# Patient Record
Sex: Female | Born: 1945 | Race: White | Hispanic: No | Smoking: Former smoker
Health system: Southern US, Community
[De-identification: ages and names within clinical notes are randomized; demographics above are authoritative.]

## PROBLEM LIST (undated history)

## (undated) ENCOUNTER — Ambulatory Visit: Admission: EM | Payer: Medicare Other | Source: Home / Self Care

## (undated) DIAGNOSIS — F0150 Vascular dementia without behavioral disturbance: Secondary | ICD-10-CM

## (undated) DIAGNOSIS — N816 Rectocele: Secondary | ICD-10-CM

## (undated) DIAGNOSIS — I10 Essential (primary) hypertension: Secondary | ICD-10-CM

## (undated) DIAGNOSIS — C801 Malignant (primary) neoplasm, unspecified: Secondary | ICD-10-CM

## (undated) DIAGNOSIS — K219 Gastro-esophageal reflux disease without esophagitis: Secondary | ICD-10-CM

## (undated) DIAGNOSIS — Z87891 Personal history of nicotine dependence: Principal | ICD-10-CM

## (undated) DIAGNOSIS — N3946 Mixed incontinence: Secondary | ICD-10-CM

## (undated) DIAGNOSIS — Z720 Tobacco use: Secondary | ICD-10-CM

## (undated) DIAGNOSIS — N183 Chronic kidney disease, stage 3 (moderate): Secondary | ICD-10-CM

## (undated) DIAGNOSIS — E782 Mixed hyperlipidemia: Secondary | ICD-10-CM

## (undated) HISTORY — DX: Gastro-esophageal reflux disease without esophagitis: K21.9

## (undated) HISTORY — DX: Personal history of nicotine dependence: Z87.891

## (undated) HISTORY — DX: Rectocele: N81.6

## (undated) HISTORY — PX: ABDOMINAL HYSTERECTOMY: SHX81

## (undated) HISTORY — DX: Chronic kidney disease, stage 3 (moderate): N18.3

## (undated) HISTORY — PX: HIP SURGERY: SHX245

## (undated) HISTORY — DX: Mixed hyperlipidemia: E78.2

## (undated) HISTORY — PX: REPLACEMENT TOTAL KNEE BILATERAL: SUR1225

## (undated) HISTORY — DX: Tobacco use: Z72.0

## (undated) HISTORY — DX: Mixed incontinence: N39.46

## (undated) HISTORY — DX: Essential (primary) hypertension: I10

---

## 2004-07-13 ENCOUNTER — Emergency Department: Payer: Self-pay | Admitting: Emergency Medicine

## 2004-07-14 ENCOUNTER — Ambulatory Visit: Payer: Self-pay | Admitting: Emergency Medicine

## 2004-10-03 ENCOUNTER — Ambulatory Visit: Payer: Self-pay | Admitting: Occupational Therapy

## 2005-01-07 ENCOUNTER — Emergency Department: Payer: Self-pay | Admitting: Emergency Medicine

## 2005-01-26 ENCOUNTER — Ambulatory Visit: Payer: Self-pay | Admitting: Occupational Therapy

## 2005-02-02 ENCOUNTER — Ambulatory Visit: Payer: Self-pay | Admitting: Unknown Physician Specialty

## 2005-02-17 ENCOUNTER — Ambulatory Visit: Payer: Self-pay | Admitting: Occupational Therapy

## 2006-02-01 ENCOUNTER — Ambulatory Visit: Payer: Self-pay | Admitting: Nurse Practitioner

## 2006-02-05 ENCOUNTER — Ambulatory Visit: Payer: Self-pay | Admitting: Nurse Practitioner

## 2007-02-07 ENCOUNTER — Ambulatory Visit: Payer: Self-pay | Admitting: Nurse Practitioner

## 2008-02-09 ENCOUNTER — Ambulatory Visit: Payer: Self-pay | Admitting: Nurse Practitioner

## 2008-09-10 ENCOUNTER — Ambulatory Visit: Payer: Self-pay | Admitting: Nurse Practitioner

## 2009-02-18 ENCOUNTER — Ambulatory Visit: Payer: Self-pay | Admitting: Nurse Practitioner

## 2010-02-20 ENCOUNTER — Ambulatory Visit: Payer: Self-pay | Admitting: Nurse Practitioner

## 2010-03-04 ENCOUNTER — Ambulatory Visit: Payer: Self-pay | Admitting: Nurse Practitioner

## 2010-10-09 DIAGNOSIS — Z720 Tobacco use: Secondary | ICD-10-CM | POA: Insufficient documentation

## 2010-10-09 DIAGNOSIS — I10 Essential (primary) hypertension: Secondary | ICD-10-CM

## 2010-10-09 DIAGNOSIS — E782 Mixed hyperlipidemia: Secondary | ICD-10-CM

## 2010-10-09 HISTORY — DX: Tobacco use: Z72.0

## 2010-10-09 HISTORY — DX: Mixed hyperlipidemia: E78.2

## 2010-10-09 HISTORY — DX: Essential (primary) hypertension: I10

## 2011-03-02 ENCOUNTER — Ambulatory Visit: Payer: Self-pay | Admitting: Nurse Practitioner

## 2012-03-02 ENCOUNTER — Ambulatory Visit: Payer: Self-pay | Admitting: Nurse Practitioner

## 2012-10-10 DIAGNOSIS — N816 Rectocele: Secondary | ICD-10-CM

## 2012-10-10 DIAGNOSIS — N3946 Mixed incontinence: Secondary | ICD-10-CM

## 2012-10-10 HISTORY — DX: Mixed incontinence: N39.46

## 2012-10-10 HISTORY — DX: Rectocele: N81.6

## 2012-10-26 ENCOUNTER — Ambulatory Visit: Payer: Self-pay | Admitting: Family Medicine

## 2012-10-26 ENCOUNTER — Ambulatory Visit: Payer: Self-pay

## 2012-10-26 LAB — URINALYSIS, COMPLETE
Bilirubin,UR: NEGATIVE
Glucose,UR: NEGATIVE mg/dL (ref 0–75)
Ketone: NEGATIVE
Nitrite: POSITIVE
Ph: 6.5 (ref 4.5–8.0)

## 2012-10-26 LAB — CBC WITH DIFFERENTIAL/PLATELET
Basophil %: 0.2 %
Eosinophil %: 0 %
HCT: 35.8 % (ref 35.0–47.0)
HGB: 11.9 g/dL — ABNORMAL LOW (ref 12.0–16.0)
Lymphocyte #: 0.7 10*3/uL — ABNORMAL LOW (ref 1.0–3.6)
Lymphocyte %: 5.5 %
MCHC: 33.3 g/dL (ref 32.0–36.0)
MCV: 89 fL (ref 80–100)
Monocyte %: 9.6 %
Neutrophil #: 11.4 10*3/uL — ABNORMAL HIGH (ref 1.4–6.5)
Neutrophil %: 84.7 %
WBC: 13.4 10*3/uL — ABNORMAL HIGH (ref 3.6–11.0)

## 2012-10-26 LAB — COMPREHENSIVE METABOLIC PANEL
Albumin: 3.4 g/dL (ref 3.4–5.0)
BUN: 20 mg/dL — ABNORMAL HIGH (ref 7–18)
Bilirubin,Total: 1.3 mg/dL — ABNORMAL HIGH (ref 0.2–1.0)
Calcium, Total: 9.2 mg/dL (ref 8.5–10.1)
Co2: 24 mmol/L (ref 21–32)
Creatinine: 1.58 mg/dL — ABNORMAL HIGH (ref 0.60–1.30)
EGFR (African American): 39 — ABNORMAL LOW
Glucose: 127 mg/dL — ABNORMAL HIGH (ref 65–99)
Osmolality: 265 (ref 275–301)
SGOT(AST): 82 U/L — ABNORMAL HIGH (ref 15–37)
Sodium: 130 mmol/L — ABNORMAL LOW (ref 136–145)
Total Protein: 7.7 g/dL (ref 6.4–8.2)

## 2012-10-26 LAB — AMYLASE: Amylase: 27 U/L (ref 25–115)

## 2012-12-09 DIAGNOSIS — N183 Chronic kidney disease, stage 3 unspecified: Secondary | ICD-10-CM

## 2012-12-09 HISTORY — DX: Chronic kidney disease, stage 3 unspecified: N18.30

## 2013-03-03 ENCOUNTER — Ambulatory Visit: Payer: Self-pay

## 2013-03-04 ENCOUNTER — Ambulatory Visit: Payer: Self-pay | Admitting: Physician Assistant

## 2014-02-09 ENCOUNTER — Ambulatory Visit: Payer: Self-pay | Admitting: Family Medicine

## 2014-03-07 ENCOUNTER — Ambulatory Visit: Payer: Self-pay | Admitting: Nurse Practitioner

## 2015-01-09 ENCOUNTER — Other Ambulatory Visit: Payer: Self-pay | Admitting: Nurse Practitioner

## 2015-01-09 DIAGNOSIS — Z1239 Encounter for other screening for malignant neoplasm of breast: Secondary | ICD-10-CM

## 2015-01-09 DIAGNOSIS — R0989 Other specified symptoms and signs involving the circulatory and respiratory systems: Secondary | ICD-10-CM

## 2015-01-28 ENCOUNTER — Ambulatory Visit: Admission: RE | Admit: 2015-01-28 | Payer: Self-pay | Source: Ambulatory Visit

## 2015-01-28 ENCOUNTER — Ambulatory Visit
Admission: RE | Admit: 2015-01-28 | Discharge: 2015-01-28 | Disposition: A | Discharge status: Home / Self Care | Payer: Medicare PPO | Source: Ambulatory Visit | Attending: Nurse Practitioner | Admitting: Nurse Practitioner

## 2015-01-28 DIAGNOSIS — R0989 Other specified symptoms and signs involving the circulatory and respiratory systems: Secondary | ICD-10-CM | POA: Insufficient documentation

## 2015-01-28 DIAGNOSIS — I6523 Occlusion and stenosis of bilateral carotid arteries: Secondary | ICD-10-CM | POA: Diagnosis not present

## 2015-01-29 ENCOUNTER — Telehealth: Payer: Self-pay | Admitting: *Deleted

## 2015-01-29 NOTE — Telephone Encounter (Signed)
  Oncology Nurse Navigator Documentation    Navigator Encounter Type: Telephone;Screening (01/29/15 1400)                          Notified patient that annual lung cancer screening low dose CT scan is due. Confirmed that patient is within the age range of 55-77, and asymptomatic, (no signs or symptoms of lung cancer). The patient is a current smoker, with a 31 pack year history. The shared decision making visit was done 02/09/14 Patient is agreeable for CT scan being scheduled after 02/10/15.

## 2015-02-05 ENCOUNTER — Other Ambulatory Visit: Payer: Self-pay | Admitting: Family Medicine

## 2015-02-05 ENCOUNTER — Encounter: Payer: Self-pay | Admitting: Family Medicine

## 2015-02-05 DIAGNOSIS — Z87891 Personal history of nicotine dependence: Secondary | ICD-10-CM

## 2015-02-05 HISTORY — DX: Personal history of nicotine dependence: Z87.891

## 2015-02-12 ENCOUNTER — Ambulatory Visit
Admission: RE | Admit: 2015-02-12 | Discharge: 2015-02-12 | Disposition: A | Discharge status: Home / Self Care | Payer: Medicare PPO | Source: Ambulatory Visit | Attending: Family Medicine | Admitting: Family Medicine

## 2015-02-12 DIAGNOSIS — I700 Atherosclerosis of aorta: Secondary | ICD-10-CM | POA: Diagnosis not present

## 2015-02-12 DIAGNOSIS — Z87891 Personal history of nicotine dependence: Secondary | ICD-10-CM | POA: Diagnosis present

## 2015-02-12 DIAGNOSIS — I2510 Atherosclerotic heart disease of native coronary artery without angina pectoris: Secondary | ICD-10-CM | POA: Insufficient documentation

## 2015-02-19 ENCOUNTER — Telehealth: Payer: Self-pay | Admitting: *Deleted

## 2015-02-19 NOTE — Telephone Encounter (Signed)
Notified patient of LDCT lung cancer screening results of Lung [facility] 1 finding with recommendation for 12 month follow up imaging. Also notified of incidental finding of coronary athersclerosis. Patient verbalizes understanding.

## 2015-03-12 ENCOUNTER — Ambulatory Visit: Payer: Self-pay

## 2015-04-16 ENCOUNTER — Ambulatory Visit
Admission: RE | Admit: 2015-04-16 | Discharge: 2015-04-16 | Disposition: A | Discharge status: Home / Self Care | Payer: Medicare PPO | Source: Ambulatory Visit | Attending: Nurse Practitioner | Admitting: Nurse Practitioner

## 2015-04-16 DIAGNOSIS — Z1231 Encounter for screening mammogram for malignant neoplasm of breast: Secondary | ICD-10-CM | POA: Diagnosis present

## 2015-04-16 DIAGNOSIS — Z1239 Encounter for other screening for malignant neoplasm of breast: Secondary | ICD-10-CM

## 2015-04-16 HISTORY — DX: Malignant (primary) neoplasm, unspecified: C80.1

## 2015-05-22 ENCOUNTER — Ambulatory Visit (INDEPENDENT_AMBULATORY_CARE_PROVIDER_SITE_OTHER): Payer: Medicare PPO | Admitting: Urology

## 2015-05-22 ENCOUNTER — Encounter: Payer: Self-pay | Admitting: Urology

## 2015-05-22 VITALS — BP 111/75 | HR 71 | Ht 66.0 in | Wt 211.8 lb

## 2015-05-22 DIAGNOSIS — N3946 Mixed incontinence: Secondary | ICD-10-CM

## 2015-05-22 DIAGNOSIS — N819 Female genital prolapse, unspecified: Secondary | ICD-10-CM

## 2015-05-22 DIAGNOSIS — N390 Urinary tract infection, site not specified: Secondary | ICD-10-CM | POA: Diagnosis not present

## 2015-05-22 LAB — URINALYSIS, COMPLETE
BILIRUBIN UA: NEGATIVE
Glucose, UA: NEGATIVE
Ketones, UA: NEGATIVE
LEUKOCYTES UA: NEGATIVE
NITRITE UA: NEGATIVE
PH UA: 6.5 (ref 5.0–7.5)
Protein, UA: NEGATIVE
RBC UA: NEGATIVE
Specific Gravity, UA: 1.01 (ref 1.005–1.030)
UUROB: 0.2 mg/dL (ref 0.2–1.0)

## 2015-05-22 LAB — MICROSCOPIC EXAMINATION: RBC MICROSCOPIC, UA: NONE SEEN /HPF (ref 0–?)

## 2015-05-22 LAB — BLADDER SCAN AMB NON-IMAGING

## 2015-05-22 MED ORDER — METHENAMINE HIPPURATE 1 G PO TABS: 1.0000 g | ORAL_TABLET | Freq: Two times a day (BID) | ORAL | Status: DC

## 2015-05-22 NOTE — Progress Notes (Signed)
05/22/2015 3:06 PM   Nancy Perkins 1946/01/10 [id number]  Referring provider: Renee Rival, NP [address] [place], [place] [address]  Chief Complaint  Patient presents with  . New Patient (Initial Visit)    chronic UTI    HPI:  72- yo F with longstanding history of recurrent UTIs, pelvic organ prolapse, and mixed urinary incontinence.  She was referred today for further evaluation of recurrent urinary tract infections.  She has had at least 5 culture positive urinary tract infections with past year has breakthrough infections on prophylactic Macrobid.   One of her UTIs was resistant to oral abx but treated successfully with fosfomycin (Citrobacter).   She has been taking Macrobid suppression for many years.  She has been using cranberry capsules as well. She was prescribed but is not using her Premarin cream as she finds this to be "messy".   She is very careful about hygiene and wiping front to back.  She generally does not have fevers with her infections. Her last episode of pyelonephritis was in 2014.   She has an extensive GYN history and undergone reconstruction in the past.  She is currently followed by Dr. Carol Ada at [facility].  She has history of mixed urinary incontinence,  history of stage III posterior vaginal wall prolapse. She underwent total vaginal hysterectomy in the past resulting in significant pelvic organ prolapse. In 2007, she underwent an abdominal sacral colpopexy, Burch procedure, and paravaginal repair.  She is not sexually active. She has tried and failed pessaries.  Last cystoscopy in 1 year ago  by Dr. Lelon Huh at [facility].  No recent renal/ bladder imaging.     She does have baseline CKD , stage III, current and 1.2. She is also recently diagnosed with diabetes, hemoglobin A1c 6.4 , currently diet controlled only.  In terms of urinary symptoms, she does have mixed urinary incontinence with mild stress component.  She has been actively losing  weight and seen an improvement in her symptoms.  She also has a known history of incomplete bladder emptying and double voids on a regular basis.  No history of gross hematuria.  She is not currently taking any medications for her overactive component.  No personal history of kidney stones.   PMH: Past Medical History  Diagnosis Date  . Personal history of tobacco use, presenting hazards to health 02/05/2015  . Cancer ([place])     skin ca  . Acid reflux   . Essential (primary) hypertension 10/09/2010  . Hernia, rectovaginal 10/10/2012  . Chronic kidney disease (CKD), stage III (moderate) 12/09/2012  . 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 Current tobacco use 10/09/2010  . Mixed incontinence 10/10/2012    Surgical History: Past Surgical History  Procedure Laterality Date  . Abdominal hysterectomy    . Hip surgery    . Replacement total knee bilateral      Home Medications:    Medication List       This list is accurate as of: 05/22/15 11:59 PM.  Always use your most recent med list.               aspirin 81 MG tablet  Take 81 mg by mouth.     CELEXA 40 MG tablet  Generic drug:  citalopram  Take 40 mg by mouth.     KLOR-CON 10 10 MEQ tablet  Generic drug:  potassium chloride  Take by mouth.     Magnesium 250 MG Tabs  Take  250 mg by mouth.     methenamine 1 g tablet  Commonly known as:  HIPREX  Take 1 tablet (1 g total) by mouth 2 (two) times daily with a meal.     nystatin cream  Commonly known as:  MYCOSTATIN  continuous as needed.     omeprazole 40 MG capsule  Commonly known as:  PRILOSEC     PREMARIN vaginal cream  Generic drug:  conjugated estrogens  1 gm as directed 2-3 times weekly     simvastatin 20 MG tablet  Commonly known as:  ZOCOR     STOOL SOFTENER 100 MG capsule  Generic drug:  docusate sodium  Take 100 mg by mouth.     Vitamin D (Ergocalciferol) 50000 units Caps capsule  Commonly known as:  DRISDOL  Take by mouth.          Allergies:  Allergies  Allergen Reactions  . Beta Adrenergic Blockers Other (See Comments)    Heart block  . Hydrochlorothiazide Other (See Comments)    Caused a heartblock  . Sulfathiazole     Other reaction(s): RASH  . Thiazide-Type Diuretics     Heart block caused by beta blockers in general    Family History: Family History  Problem Relation Age of Onset  . Breast cancer Sister 73  . Breast cancer Maternal Aunt   . Breast cancer Cousin 17    paternal  . Bladder Cancer Neg Hx   . Prostate cancer Maternal Grandfather   . Kidney cancer Neg Hx     Social History:  reports that she has been smoking.  She does not have any smokeless tobacco history on file. Her alcohol and drug histories are not on file.  ROS: UROLOGY Frequent Urination?: Yes Hard to postpone urination?: Yes Burning/pain with urination?: Yes Get up at night to urinate?: No Leakage of urine?: Yes Urine stream starts and stops?: No Trouble starting stream?: No Do you have to strain to urinate?: No Blood in urine?: No Urinary tract infection?: Yes Sexually transmitted disease?: No Injury to kidneys or bladder?: No Painful intercourse?: No Weak stream?: No Currently pregnant?: No Vaginal bleeding?: No Last menstrual period?: hysterectomy  Gastrointestinal Nausea?: No Vomiting?: No Indigestion/heartburn?: No Diarrhea?: No Constipation?: No  Constitutional Fever: No Night sweats?: No Weight loss?: No Fatigue?: No  Skin Skin rash/lesions?: Yes Itching?: No  Eyes Blurred vision?: No Double vision?: No  Ears/Nose/Throat Sore throat?: No Sinus problems?: Yes  Hematologic/Lymphatic Swollen glands?: No Easy bruising?: Yes  Cardiovascular Leg swelling?: Yes Chest pain?: No  Respiratory Cough?: No Shortness of breath?: No  Endocrine Excessive thirst?: No  Musculoskeletal Back pain?: Yes Joint pain?: Yes  Neurological Headaches?: No Dizziness?:  No  Psychologic Depression?: Yes Anxiety?: Yes  Physical Exam: BP 111/75 mmHg  Pulse 71  Ht 5\' 6"  (1.676 m)  Wt 211 lb 12.8 oz (96.072 kg)  BMI 34.20 kg/m2  Constitutional:  Alert and oriented, No acute distress. HEENT: [place] AT, moist mucus membranes.  Trachea midline, no masses. Cardiovascular: No clubbing, cyanosis.  Bilateral pitting lower extremity edema, left greater than right with significant varicose veins. Respiratory: Normal respiratory effort, no increased work of breathing. GI: Abdomen is soft, nontender, nondistended, no abdominal masses. Obese. GU: No CVA tenderness.  Skin: No rashes, bruises or suspicious lesions. Lymph: No cervical adenopathy. Neurologic: Grossly intact, no focal deficits, moving all 4 extremities. Psychiatric: Normal mood and affect.  Laboratory Data: Cr 1.2  Urinalysis Results for orders placed or performed in  visit on 05/22/15  Microscopic Examination  Result Value Ref Range   WBC, UA 0-5 0 -  5 /hpf   RBC, UA None seen 0 -  2 /hpf   Epithelial Cells (non renal) 0-10 0 - 10 /hpf   Mucus, UA Present (A) Not Estab.   Bacteria, UA Few (A) None seen/Few  Urinalysis, Complete  Result Value Ref Range   Specific Gravity, UA 1.010 1.005 - 1.030   pH, UA 6.5 5.0 - 7.5   Color, UA Yellow Yellow   Appearance Ur Clear Clear   Leukocytes, UA Negative Negative   Protein, UA Negative Negative/Trace   Glucose, UA Negative Negative   Ketones, UA Negative Negative   RBC, UA Negative Negative   Bilirubin, UA Negative Negative   Urobilinogen, Ur 0.2 0.2 - 1.0 mg/dL   Nitrite, UA Negative Negative   Microscopic Examination See below:   Bladder Scan (Post Void Residual) in office  Result Value Ref Range   Scan Result 49ml    Urine culture data reviewed.  Pertinent Imaging: No recent KUB/ CT/ or renal ultrasound data  Assessment & Plan:    1. Recurrent UTI History of long-standing recurrent breakthrough urinary tract infections despite Macrobid  prophylaxis. Recent cultures have been resistant to Macrobid. She is being very appropriately managed by urogynecology, Dr. Lelon Huh with cranberry tabs, recommendation of estrogen cream, hygiene recommendations, etc. Given the concern for development of antibiotic resistance and pulmonary fibrosis, I have recommended cessation of Macrobid prophylaxis and we will start her on methenamine instead. We reviewed other behavioral recommendations including continuation of double voiding although no evidence of incomplete bladder emptying today, and increasing water intake which she admits to being slack about. I have also recommended that she resume her vaginal/periurethral estrogen cream as this is the most evidence based recommendation for urinary tract infection prevention in postmenopausal women.  Cystoscopy performed approximate one year ago therefore no need to repeat the study. For completeness, I have recommended obtaining a renal ultrasound to ensure that there are no upper tract nidus is for recurrent infections.   - Urinalysis, Complete - Bladder Scan (Post Void Residual) in office - RUS (will call with results) methenamine ordered  2. Mixed stress and urge urinary incontinence Minimal bother. Recommend continuation of Kegal exercises and weight loss.  3. Prolapse of female pelvic organs Managed by Dr. Lelon Huh at [facility].   Return in about 1 year (around 05/21/2016) for symtom recheck. or sooner as needed  Hollice Espy, MD  [facility] [address], [place], [place] [address] [phone]

## 2015-06-03 ENCOUNTER — Ambulatory Visit
Admission: RE | Admit: 2015-06-03 | Discharge: 2015-06-03 | Disposition: A | Discharge status: Home / Self Care | Payer: Medicare PPO | Source: Ambulatory Visit | Attending: Urology | Admitting: Urology

## 2015-06-03 DIAGNOSIS — N281 Cyst of kidney, acquired: Secondary | ICD-10-CM | POA: Insufficient documentation

## 2015-06-03 DIAGNOSIS — N390 Urinary tract infection, site not specified: Secondary | ICD-10-CM | POA: Insufficient documentation

## 2015-06-04 ENCOUNTER — Telehealth: Payer: Self-pay

## 2015-06-04 NOTE — Telephone Encounter (Signed)
-----   Message from Hollice Espy, MD sent at 06/03/2015  8:17 PM EST ----- Please let this patient notes that her renal ultrasound looks great. There is no evidence of kidney stones or any swelling of her kidneys.  She does have a simple benign cyst on the right kidney which is less than a centimeter and should not be causing pain or any other issues.    Hollice Espy, MD

## 2015-06-04 NOTE — Telephone Encounter (Signed)
Spoke with patient and gave her the results and she had no questions  Sharyn Lull

## 2015-06-04 NOTE — Telephone Encounter (Signed)
LMOM

## 2015-06-05 ENCOUNTER — Encounter: Payer: Self-pay | Admitting: Obstetrics and Gynecology

## 2015-06-05 ENCOUNTER — Ambulatory Visit (INDEPENDENT_AMBULATORY_CARE_PROVIDER_SITE_OTHER): Payer: Medicare PPO | Admitting: Obstetrics and Gynecology

## 2015-06-05 VITALS — BP 152/82 | HR 72 | Ht 66.0 in | Wt 210.0 lb

## 2015-06-05 DIAGNOSIS — N390 Urinary tract infection, site not specified: Secondary | ICD-10-CM | POA: Diagnosis not present

## 2015-06-05 LAB — URINALYSIS, COMPLETE
BILIRUBIN UA: NEGATIVE
GLUCOSE, UA: NEGATIVE
KETONES UA: NEGATIVE
Leukocytes, UA: NEGATIVE
NITRITE UA: POSITIVE — AB
SPEC GRAV UA: 1.015 (ref 1.005–1.030)
UUROB: 0.2 mg/dL (ref 0.2–1.0)
pH, UA: 6 (ref 5.0–7.5)

## 2015-06-05 LAB — MICROSCOPIC EXAMINATION
RBC, UA: NONE SEEN /hpf (ref 0–?)
WBC, UA: >30 /hpf — ABNORMAL HIGH (ref 0–?)

## 2015-06-05 NOTE — Progress Notes (Signed)
12:23 PM   Nancy Perkins 1946/04/25 [id number]  Referring provider: Renee Rival, NP [address] [place], [place] [address]  Chief Complaint  Patient presents with  . Recurrent UTI    HPI: 63- yo F with longstanding history of recurrent UTIs, pelvic organ prolapse, and mixed urinary incontinence.  She was referred today for further evaluation of recurrent urinary tract infections.  She has had at least 5 culture positive urinary tract infections with past year has breakthrough infections on prophylactic Macrobid.   One of her UTIs was resistant to oral abx but treated successfully with fosfomycin (Citrobacter).   She has been taking Macrobid suppression for many years.  She has been using cranberry capsules as well. She was prescribed but is not using her Premarin cream as she finds this to be "messy".   She is very careful about hygiene and wiping front to back.  She generally does not have fevers with her infections. Her last episode of pyelonephritis was in 2014.   She has an extensive GYN history and undergone reconstruction in the past.  She is currently followed by Dr. Carol Ada at [facility].  She has history of mixed urinary incontinence,  history of stage III posterior vaginal wall prolapse. She underwent total vaginal hysterectomy in the past resulting in significant pelvic organ prolapse. In 2007, she underwent an abdominal sacral colpopexy, Burch procedure, and paravaginal repair.  She is not sexually active. She has tried and failed pessaries.  Last cystoscopy in 1 year ago  by Dr. Lelon Huh at [facility].  No recent renal/ bladder imaging.     She does have baseline CKD , stage III, current and 1.2. She is also recently diagnosed with diabetes, hemoglobin A1c 6.4 , currently diet controlled only.  In terms of urinary symptoms, she does have mixed urinary incontinence with mild stress component.  She has been actively losing weight and seen an improvement in her  symptoms.  She also has a known history of incomplete bladder emptying and double voids on a regular basis.  No history of gross hematuria.  She is not currently taking any medications for her overactive component.  No personal history of kidney stones.  Interval History: Patient states that she has discontinued Macrobid and started methenamine as recommended last visit. She has been trying to use her vaginal estrogen cream regularly and has been increasing her fluid intake. She recently began to experience dysuria and frequency. She denies fevers, flank pain or gross hematuria.   PMH: Past Medical History  Diagnosis Date  . Personal history of tobacco use, presenting hazards to health 02/05/2015  . Cancer ([place])     skin ca  . Acid reflux   . Essential (primary) hypertension 10/09/2010  . Hernia, rectovaginal 10/10/2012  . Chronic kidney disease (CKD), stage III (moderate) 12/09/2012  . 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 Current tobacco use 10/09/2010  . Mixed incontinence 10/10/2012    Surgical History: Past Surgical History  Procedure Laterality Date  . Abdominal hysterectomy    . Hip surgery    . Replacement total knee bilateral      Home Medications:    Medication List       This list is accurate as of: 06/05/15 12:23 PM.  Always use your most recent med list.               aspirin 81 MG tablet  Take 81 mg by mouth.     CELEXA  40 MG tablet  Generic drug:  citalopram  Take 40 mg by mouth.     KLOR-CON 10 10 MEQ tablet  Generic drug:  potassium chloride  Take by mouth.     Magnesium 250 MG Tabs  Take 250 mg by mouth.     methenamine 1 g tablet  Commonly known as:  HIPREX  Take 1 tablet (1 g total) by mouth 2 (two) times daily with a meal.     methylPREDNISolone 4 MG Tbpk tablet  Commonly known as:  MEDROL DOSEPAK     nystatin cream  Commonly known as:  MYCOSTATIN  continuous as needed.     omeprazole 40 MG capsule  Commonly  known as:  PRILOSEC     PREMARIN vaginal cream  Generic drug:  conjugated estrogens  1 gm as directed 2-3 times weekly     simvastatin 20 MG tablet  Commonly known as:  ZOCOR     STOOL SOFTENER 100 MG capsule  Generic drug:  docusate sodium  Take 100 mg by mouth.     Vitamin D (Ergocalciferol) 50000 units Caps capsule  Commonly known as:  DRISDOL  Take by mouth.        Allergies:  Allergies  Allergen Reactions  . Beta Adrenergic Blockers Other (See Comments)    Heart block  . Hydrochlorothiazide Other (See Comments)    Caused a heartblock  . Sulfathiazole     Other reaction(s): RASH  . Thiazide-Type Diuretics     Heart block caused by beta blockers in general    Family History: Family History  Problem Relation Age of Onset  . Breast cancer Sister 27  . Breast cancer Maternal Aunt   . Breast cancer Cousin 60    paternal  . Bladder Cancer Neg Hx   . Prostate cancer Maternal Grandfather   . Kidney cancer Neg Hx     Social History:  reports that she has been smoking.  She does not have any smokeless tobacco history on file. Her alcohol and drug histories are not on file.  ROS: UROLOGY Frequent Urination?: Yes Hard to postpone urination?: Yes Burning/pain with urination?: Yes Get up at night to urinate?: No Leakage of urine?: Yes Urine stream starts and stops?: No Trouble starting stream?: No Do you have to strain to urinate?: No Blood in urine?: No Urinary tract infection?: Yes Sexually transmitted disease?: No Injury to kidneys or bladder?: No Painful intercourse?: No Weak stream?: No Currently pregnant?: No Vaginal bleeding?: No Last menstrual period?: n  Gastrointestinal Nausea?: No Vomiting?: No Indigestion/heartburn?: No Diarrhea?: No Constipation?: No  Constitutional Fever: No Night sweats?: No Weight loss?: No Fatigue?: No  Skin Skin rash/lesions?: No Itching?: No  Eyes Blurred vision?: No Double vision?:  No  Ears/Nose/Throat Sore throat?: No Sinus problems?: No  Hematologic/Lymphatic Swollen glands?: No Easy bruising?: Yes  Cardiovascular Leg swelling?: Yes Chest pain?: No  Respiratory Cough?: No Shortness of breath?: No  Endocrine Excessive thirst?: No  Musculoskeletal Back pain?: Yes Joint pain?: Yes  Neurological Headaches?: No Dizziness?: No  Psychologic Depression?: Yes Anxiety?: No  Physical Exam: BP 152/82 mmHg  Pulse 72  Ht 5\' 6"  (1.676 m)  Wt 210 lb (95.255 kg)  BMI 33.91 kg/m2  Constitutional:  Alert and oriented, No acute distress. HEENT: [place] AT, moist mucus membranes.  Trachea midline, no masses. Respiratory: Normal respiratory effort, no increased work of breathing. GI: Abdomen is soft, nontender, nondistended, no abdominal masses. Obese. GU: No CVA tenderness.  Skin: No  rashes, bruises or suspicious lesions. Neurologic: Grossly intact, no focal deficits, moving all 4 extremities. Psychiatric: Normal mood and affect.  Laboratory Data: Cr 1.2  Urinalysis Results for orders placed or performed in visit on 06/05/15  Microscopic Examination  Result Value Ref Range   WBC, UA >30 (H) 0 -  5 /hpf   RBC, UA None seen 0 -  2 /hpf   Epithelial Cells (non renal) 0-10 0 - 10 /hpf   Bacteria, UA Many (A) None seen/Few  Urinalysis, Complete  Result Value Ref Range   Specific Gravity, UA 1.015 1.005 - 1.030   pH, UA 6.0 5.0 - 7.5   Color, UA Yellow Yellow   Appearance Ur Cloudy (A) Clear   Leukocytes, UA Negative Negative   Protein, UA 1+ (A) Negative/Trace   Glucose, UA Negative Negative   Ketones, UA Negative Negative   RBC, UA 2+ (A) Negative   Bilirubin, UA Negative Negative   Urobilinogen, Ur 0.2 0.2 - 1.0 mg/dL   Nitrite, UA Positive (A) Negative   Microscopic Examination See below:     Pertinent Imaging: No recent KUB/ CT/ or renal ultrasound data  Assessment & Plan:    1. Recurrent UTI- Macrobid was discontinued at his last visit  and started. Patient states that she has been using her vaginal estrogen cream as recommended. She has tried to increase her water intake. UTI prevention strategies reiterated today.  Renal ultrasound was negative for GU pathology. UA suspicious for infection today we sent for culture. Will treat pending culture results. Patient was instructed to call on Friday to inquire about results if she has not heard from our office. She understands that she was to seek immediate attention for fevers, severe flank pain or uncontrolled nausea vomiting. - Urinalysis, Complete - Bladder Scan (Post Void Residual) in office - RUS (will call with results)  2. Mixed stress and urge urinary incontinence Minimal bother. Recommend continuation of Kegal exercises and weight loss.  3. Prolapse of female pelvic organs Managed by Dr. Lelon Huh at [facility].   Return in about 3 weeks (around 06/26/2015).   Herbert Moors, [facility] [address] [place], [place] [address] [phone]

## 2015-06-05 NOTE — Patient Instructions (Signed)

## 2015-06-07 ENCOUNTER — Telehealth: Payer: Self-pay | Admitting: Obstetrics and Gynecology

## 2015-06-07 LAB — CULTURE, URINE COMPREHENSIVE

## 2015-06-07 MED ORDER — AMOXICILLIN-POT CLAVULANATE 875-125 MG PO TABS: 1.0000 | ORAL_TABLET | Freq: Two times a day (BID) | ORAL | Status: DC

## 2015-06-07 NOTE — Telephone Encounter (Signed)
Please notify patient that her urine culture was positive for infection. I sent a prescription for Augmentin for her to take and she can return in 2-3 weeks for repeat urine culture to confirm that her infection has resolved. When she is finished Augmentin she should resume her preventative methenamine as previously instructed by Dr. Erlene Quan. Thanks

## 2015-06-07 NOTE — Telephone Encounter (Signed)
Patient notified, she has an apt in February and was told to just keep that apt for follow up on her urine

## 2015-06-24 ENCOUNTER — Ambulatory Visit (INDEPENDENT_AMBULATORY_CARE_PROVIDER_SITE_OTHER): Payer: Medicare PPO | Admitting: Obstetrics and Gynecology

## 2015-06-24 ENCOUNTER — Encounter: Payer: Self-pay | Admitting: Obstetrics and Gynecology

## 2015-06-24 VITALS — BP 136/77 | HR 82 | Resp 16 | Ht 66.0 in | Wt 213.8 lb

## 2015-06-24 DIAGNOSIS — N390 Urinary tract infection, site not specified: Secondary | ICD-10-CM | POA: Diagnosis not present

## 2015-06-24 NOTE — Progress Notes (Signed)
1:46 PM   Nancy Perkins 1946-04-19 [id number]  Referring provider: Renee Rival, NP [address]O. [place], [place] [address]  Chief Complaint  Patient presents with  . Dysuria    HPI: 35- yo F with longstanding history of recurrent UTIs, pelvic organ prolapse, and mixed urinary incontinence.  She was referred today for further evaluation of recurrent urinary tract infections.  She has had at least 5 culture positive urinary tract infections with past year has breakthrough infections on prophylactic Macrobid.   One of her UTIs was resistant to oral abx but treated successfully with fosfomycin (Citrobacter).   She has been taking Macrobid suppression for many years.  She has been using cranberry capsules as well. She was prescribed but is not using her Premarin cream as she finds this to be "messy".   She is very careful about hygiene and wiping front to back.  She generally does not have fevers with her infections. Her last episode of pyelonephritis was in 2014.   She has an extensive GYN history and undergone reconstruction in the past.  She is currently followed by Dr. Carol Ada at [facility].  She has history of mixed urinary incontinence,  history of stage III posterior vaginal wall prolapse. She underwent total vaginal hysterectomy in the past resulting in significant pelvic organ prolapse. In 2007, she underwent an abdominal sacral colpopexy, Burch procedure, and paravaginal repair.  She is not sexually active. She has tried and failed pessaries.  Last cystoscopy in 1 year ago  by Dr. Lelon Huh at [facility].  No recent renal/ bladder imaging.     She does have baseline CKD , stage III, current and 1.2. She is also recently diagnosed with diabetes, hemoglobin A1c 6.4 , currently diet controlled only.  In terms of urinary symptoms, she does have mixed urinary incontinence with mild stress component.  She has been actively losing weight and seen an improvement in her symptoms.   She also has a known history of incomplete bladder emptying and double voids on a regular basis.  No history of gross hematuria.  She is not currently taking any medications for her overactive component.  No personal history of kidney stones.  Interval History: Patient completed Amoxicillin for UTI diagnosed 06/05/15 and resumed suppressive methenamine . She has been trying to use her vaginal estrogen cream regularly and but has not significantly increased her fluid intake. Over the last 2 days began to experience dysuria and frequency. She denies fevers, flank pain or gross hematuria.  She has been drinking only 2 bottles of water daily. Has been taking cranberry supplements and probiotics as directed.   PMH: Past Medical History  Diagnosis Date  . Personal history of tobacco use, presenting hazards to health 02/05/2015  . Cancer ([place])     skin ca  . Acid reflux   . Essential (primary) hypertension 10/09/2010  . Hernia, rectovaginal 10/10/2012  . Chronic kidney disease (CKD), stage III (moderate) 12/09/2012  . 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 Current tobacco use 10/09/2010  . Mixed incontinence 10/10/2012    Surgical History: Past Surgical History  Procedure Laterality Date  . Abdominal hysterectomy    . Hip surgery    . Replacement total knee bilateral      Home Medications:    Medication List       This list is accurate as of: 06/24/15  1:46 PM.  Always use your most recent med list.  aspirin 81 MG tablet  Take 81 mg by mouth.     CELEXA 40 MG tablet  Generic drug:  citalopram  Take 40 mg by mouth.     HYDROcodone-acetaminophen 5-325 MG tablet  Commonly known as:  NORCO/VICODIN     KLOR-CON 10 10 MEQ tablet  Generic drug:  potassium chloride  Take by mouth.     Magnesium 250 MG Tabs  Take 250 mg by mouth.     methenamine 1 g tablet  Commonly known as:  HIPREX  Take 1 tablet (1 g total) by mouth 2 (two) times daily with  a meal.     nystatin cream  Commonly known as:  MYCOSTATIN  continuous as needed.     omeprazole 40 MG capsule  Commonly known as:  PRILOSEC     PREMARIN vaginal cream  Generic drug:  conjugated estrogens  1 gm as directed 2-3 times weekly     simvastatin 20 MG tablet  Commonly known as:  ZOCOR     STOOL SOFTENER 100 MG capsule  Generic drug:  docusate sodium  Take 100 mg by mouth.     Vitamin D (Ergocalciferol) 50000 units Caps capsule  Commonly known as:  DRISDOL  Take by mouth.        Allergies:  Allergies  Allergen Reactions  . Beta Adrenergic Blockers Other (See Comments)    Heart block  . Hydrochlorothiazide Other (See Comments)    Caused a heartblock  . Sulfathiazole     Other reaction(s): RASH  . Thiazide-Type Diuretics     Heart block caused by beta blockers in general    Family History: Family History  Problem Relation Age of Onset  . Breast cancer Sister 71  . Breast cancer Maternal Aunt   . Breast cancer Cousin 26    paternal  . Bladder Cancer Neg Hx   . Prostate cancer Maternal Grandfather   . Kidney cancer Neg Hx     Social History:  reports that she has been smoking.  She does not have any smokeless tobacco history on file. Her alcohol and drug histories are not on file.  ROS: UROLOGY Frequent Urination?: Yes Hard to postpone urination?: No Burning/pain with urination?: Yes Get up at night to urinate?: No Leakage of urine?: No Urine stream starts and stops?: No Trouble starting stream?: No Do you have to strain to urinate?: No Blood in urine?: No Urinary tract infection?: Yes Sexually transmitted disease?: No Injury to kidneys or bladder?: No Painful intercourse?: No Weak stream?: No Currently pregnant?: No Vaginal bleeding?: No Last menstrual period?: n  Gastrointestinal Nausea?: No Vomiting?: No Indigestion/heartburn?: No Diarrhea?: No Constipation?: No  Constitutional Fever: No Night sweats?: No Weight loss?:  No Fatigue?: No  Skin Skin rash/lesions?: No Itching?: No  Eyes Blurred vision?: No Double vision?: No  Ears/Nose/Throat Sore throat?: No Sinus problems?: No  Hematologic/Lymphatic Swollen glands?: No Easy bruising?: No  Cardiovascular Leg swelling?: Yes Chest pain?: No  Respiratory Cough?: No Shortness of breath?: No  Endocrine Excessive thirst?: No  Musculoskeletal Back pain?: No Joint pain?: No  Neurological Headaches?: No Dizziness?: No  Psychologic Depression?: No Anxiety?: Yes  Physical Exam: BP 136/77 mmHg  Pulse 82  Resp 16  Ht 5\' 6"  (1.676 m)  Wt 213 lb 12.8 oz (96.979 kg)  BMI 34.52 kg/m2  Constitutional:  Alert and oriented, No acute distress. HEENT: [place] AT, moist mucus membranes.  Trachea midline, no masses. Respiratory: Normal respiratory effort, no increased work of  breathing. GI: Abdomen is soft, nontender, nondistended, no abdominal masses. Obese. GU: No CVA tenderness.  Skin: No rashes, bruises or suspicious lesions. Neurologic: Grossly intact, no focal deficits, moving all 4 extremities. Psychiatric: Normal mood and affect.  Laboratory Data: Cr 1.2  Urinalysis Results for orders placed or performed in visit on 06/05/15  CULTURE, URINE COMPREHENSIVE  Result Value Ref Range   Urine Culture, Comprehensive Final report (A)    Result 1 Escherichia coli (A)    ANTIMICROBIAL SUSCEPTIBILITY Comment   Microscopic Examination  Result Value Ref Range   WBC, UA >30 (H) 0 -  5 /hpf   RBC, UA None seen 0 -  2 /hpf   Epithelial Cells (non renal) 0-10 0 - 10 /hpf   Bacteria, UA Many (A) None seen/Few  Urinalysis, Complete  Result Value Ref Range   Specific Gravity, UA 1.015 1.005 - 1.030   pH, UA 6.0 5.0 - 7.5   Color, UA Yellow Yellow   Appearance Ur Cloudy (A) Clear   Leukocytes, UA Negative Negative   Protein, UA 1+ (A) Negative/Trace   Glucose, UA Negative Negative   Ketones, UA Negative Negative   RBC, UA 2+ (A) Negative    Bilirubin, UA Negative Negative   Urobilinogen, Ur 0.2 0.2 - 1.0 mg/dL   Nitrite, UA Positive (A) Negative   Microscopic Examination See below:      Pertinent Imaging: RUS negative for GU pathology  Assessment & Plan:    1. Recurrent UTI- Patient states that she has been using her vaginal estrogen cream as recommended. UTI prevention strategies reiterated today. Will continue methenamine. She has not increased her daily water intake and I encouraged her to do so to help prevent further urinary tract infections. Renal ultrasound was negative for GU pathology. We will obtain a CT renal stone study for evaluation of possible renal stones as source for patient's recurrent urinary tract infections. UA suspicious for infection today we sent for culture. Will treat pending culture results. She understands that she was to seek immediate attention for fevers, severe flank pain or uncontrolled nausea vomiting. - Urinalysis, Complete -Urine Culture - Bladder Scan (Post Void Residual) in office - CT Renal Stone   2. Mixed stress and urge urinary incontinence Minimal bother. Recommend continuation of Kegal exercises and weight loss.  3. Prolapse of female pelvic organs Managed by Dr. Lelon Huh at [facility].   Return for CT Stone results.   Herbert Moors, [facility] [address] [place], [place] [address] [phone]

## 2015-06-25 LAB — URINALYSIS, COMPLETE
BILIRUBIN UA: NEGATIVE
Glucose, UA: NEGATIVE
KETONES UA: NEGATIVE
Nitrite, UA: POSITIVE — AB
PH UA: 5.5 (ref 5.0–7.5)
SPEC GRAV UA: 1.015 (ref 1.005–1.030)
Urobilinogen, Ur: 0.2 mg/dL (ref 0.2–1.0)

## 2015-06-25 LAB — MICROSCOPIC EXAMINATION
BACTERIA UA: NONE SEEN
RBC, UA: >30 /hpf — AB (ref 0–?)
WBC, UA: >30 /hpf — AB (ref 0–?)

## 2015-06-26 ENCOUNTER — Ambulatory Visit: Payer: Medicare PPO | Admitting: Obstetrics and Gynecology

## 2015-06-26 LAB — CULTURE, URINE COMPREHENSIVE

## 2015-06-27 ENCOUNTER — Telehealth: Payer: Self-pay | Admitting: Urology

## 2015-06-27 ENCOUNTER — Telehealth: Payer: Self-pay

## 2015-06-27 DIAGNOSIS — N390 Urinary tract infection, site not specified: Secondary | ICD-10-CM

## 2015-06-27 MED ORDER — AMOXICILLIN-POT CLAVULANATE 500-125 MG PO TABS: 1.0000 | ORAL_TABLET | Freq: Two times a day (BID) | ORAL | Status: AC

## 2015-06-27 NOTE — Telephone Encounter (Signed)
Pt walked in asking for ucx results. Made aware of positive ucx and medication has been sent to pharmacy. Pt voiced understanding.

## 2015-06-27 NOTE — Telephone Encounter (Signed)
Pt called and wants to know her results from urine culture.  Please call pt at [phone]

## 2015-06-27 NOTE — Telephone Encounter (Signed)
-----   Message from Roda Shutters, [place] sent at 06/26/2015  3:39 PM EST ----- Please notify patient that her urine culture was positive for infection. Please send in a prescription for Augmentin 500 mg twice daily 1 week. Thanks

## 2015-06-28 ENCOUNTER — Telehealth: Payer: Self-pay | Admitting: Urology

## 2015-06-28 ENCOUNTER — Ambulatory Visit
Admission: RE | Admit: 2015-06-28 | Discharge: 2015-06-28 | Disposition: A | Discharge status: Home / Self Care | Payer: Medicare PPO | Source: Ambulatory Visit | Attending: Obstetrics and Gynecology | Admitting: Obstetrics and Gynecology

## 2015-06-28 DIAGNOSIS — I700 Atherosclerosis of aorta: Secondary | ICD-10-CM | POA: Diagnosis not present

## 2015-06-28 DIAGNOSIS — N390 Urinary tract infection, site not specified: Secondary | ICD-10-CM | POA: Diagnosis present

## 2015-06-28 NOTE — Telephone Encounter (Signed)
Pt called stating that [facility] called her and LM for her to call [place] back.  Please call patient back.

## 2015-07-01 NOTE — Telephone Encounter (Signed)
Patient notified of results per telephone note by chelsea watkins

## 2015-07-01 NOTE — Telephone Encounter (Signed)
LMOM

## 2015-07-02 NOTE — Telephone Encounter (Signed)
Spoke with pt in reference to medication. Pt stated she has been taking the augmentin.

## 2015-07-03 ENCOUNTER — Ambulatory Visit (INDEPENDENT_AMBULATORY_CARE_PROVIDER_SITE_OTHER): Payer: Medicare PPO | Admitting: Obstetrics and Gynecology

## 2015-07-03 ENCOUNTER — Encounter: Payer: Self-pay | Admitting: Obstetrics and Gynecology

## 2015-07-03 VITALS — BP 127/72 | HR 80 | Resp 16 | Ht 66.0 in | Wt 211.3 lb

## 2015-07-03 DIAGNOSIS — N390 Urinary tract infection, site not specified: Secondary | ICD-10-CM

## 2015-07-03 LAB — MICROSCOPIC EXAMINATION: BACTERIA UA: NONE SEEN

## 2015-07-03 LAB — URINALYSIS, COMPLETE
BILIRUBIN UA: NEGATIVE
Glucose, UA: NEGATIVE
KETONES UA: NEGATIVE
LEUKOCYTES UA: NEGATIVE
Nitrite, UA: NEGATIVE
PROTEIN UA: NEGATIVE
RBC, UA: NEGATIVE
SPEC GRAV UA: 1.015 (ref 1.005–1.030)
Urobilinogen, Ur: 0.2 mg/dL (ref 0.2–1.0)
pH, UA: 6 (ref 5.0–7.5)

## 2015-07-03 NOTE — Progress Notes (Signed)
4:46 PM   Nancy Perkins Jul 05, 1945 [id number]  Referring provider: Renee Rival, NP P.O. [place], [place] [address]  Chief Complaint  Patient presents with  . Results    CT  . Recurrent UTI    HPI: 8- yo F with longstanding history of recurrent UTIs, pelvic organ prolapse, and mixed urinary incontinence.  She was referred today for further evaluation of recurrent urinary tract infections.  She has had at least 5 culture positive urinary tract infections with past year has breakthrough infections on prophylactic Macrobid.   One of her UTIs was resistant to oral abx but treated successfully with fosfomycin (Citrobacter).   She has been taking Macrobid suppression for many years.  She has been using cranberry capsules as well. She was prescribed but is not using her Premarin cream as she finds this to be "messy".   She is very careful about hygiene and wiping front to back.  She generally does not have fevers with her infections. Her last episode of pyelonephritis was in 2014.   She has an extensive GYN history and undergone reconstruction in the past.  She is currently followed by Dr. Carol Ada at [facility]  She has history of mixed urinary incontinence,  history of stage III posterior vaginal wall prolapse. She underwent total vaginal hysterectomy in the past resulting in significant pelvic organ prolapse. In 2007, she underwent an abdominal sacral colpopexy, Burch procedure, and paravaginal repair.  She is not sexually active. She has tried and failed pessaries.  Last cystoscopy in 1 year ago  by Dr. Lelon Huh at [facility].  No recent renal/ bladder imaging.     She does have baseline CKD , stage III, current and 1.2. She is also recently diagnosed with diabetes, hemoglobin A1c 6.4 , currently diet controlled only.  In terms of urinary symptoms, she does have mixed urinary incontinence with mild stress component.  She has been actively losing weight and seen an  improvement in her symptoms.  She also has a known history of incomplete bladder emptying and double voids on a regular basis.  No history of gross hematuria.  She is not currently taking any medications for her overactive component.  No personal history of kidney stones.  Interval History: Patient completed Amoxicillin for UTI diagnosed 06/05/15 and resumed suppressive methenamine . She has been trying to use her vaginal estrogen cream regularly and but has not significantly increased her fluid intake. Over the last 2 days began to experience dysuria and frequency. She denies fevers, flank pain or gross hematuria.  She has been drinking more water daily. Has been taking cranberry supplements and probiotics as directed.   Reports complete resolution of urinary symptoms since starting Augmentin.  CT Renal Stone negative.   PMH: Past Medical History  Diagnosis Date  . Personal history of tobacco use, presenting hazards to health 02/05/2015  . Cancer ([place])     skin ca  . Acid reflux   . Essential (primary) hypertension 10/09/2010  . Hernia, rectovaginal 10/10/2012  . Chronic kidney disease (CKD), stage III (moderate) 12/09/2012  . 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 Current tobacco use 10/09/2010  . Mixed incontinence 10/10/2012    Surgical History: Past Surgical History  Procedure Laterality Date  . Abdominal hysterectomy    . Hip surgery    . Replacement total knee bilateral      Home Medications:    Medication List       This list is  accurate as of: 07/03/15 11:59 PM.  Always use your most recent med list.               amoxicillin-clavulanate 500-125 MG tablet  Commonly known as:  AUGMENTIN  Take 1 tablet (500 mg total) by mouth 2 times daily at 12 noon and 4 pm.     aspirin 81 MG tablet  Take 81 mg by mouth.     CELEXA 40 MG tablet  Generic drug:  citalopram  Take 40 mg by mouth.     HYDROcodone-acetaminophen 5-325 MG tablet  Commonly  known as:  NORCO/VICODIN     KLOR-CON 10 10 MEQ tablet  Generic drug:  potassium chloride  Take by mouth.     Magnesium 250 MG Tabs  Take 250 mg by mouth.     methenamine 1 g tablet  Commonly known as:  HIPREX  Take 1 tablet (1 g total) by mouth 2 (two) times daily with a meal.     nystatin cream  Commonly known as:  MYCOSTATIN  continuous as needed.     omeprazole 40 MG capsule  Commonly known as:  PRILOSEC     PREMARIN vaginal cream  Generic drug:  conjugated estrogens  1 gm as directed 2-3 times weekly     simvastatin 20 MG tablet  Commonly known as:  ZOCOR     STOOL SOFTENER 100 MG capsule  Generic drug:  docusate sodium  Take 100 mg by mouth.     Vitamin D (Ergocalciferol) 50000 units Caps capsule  Commonly known as:  DRISDOL  Take by mouth.        Allergies:  Allergies  Allergen Reactions  . Beta Adrenergic Blockers Other (See Comments)    Heart block  . Hydrochlorothiazide Other (See Comments)    Caused a heartblock  . Sulfathiazole     Other reaction(s): RASH  . Thiazide-Type Diuretics     Heart block caused by beta blockers in general    Family History: Family History  Problem Relation Age of Onset  . Breast cancer Sister 63  . Breast cancer Maternal Aunt   . Breast cancer Cousin 76    paternal  . Bladder Cancer Neg Hx   . Prostate cancer Maternal Grandfather   . Kidney cancer Neg Hx     Social History:  reports that she has been smoking.  She does not have any smokeless tobacco history on file. Her alcohol and drug histories are not on file.  ROS: UROLOGY Frequent Urination?: No Hard to postpone urination?: No Burning/pain with urination?: Yes Get up at night to urinate?: No Leakage of urine?: Yes Urine stream starts and stops?: No Trouble starting stream?: No Do you have to strain to urinate?: No Blood in urine?: No Urinary tract infection?: Yes Sexually transmitted disease?: No Injury to kidneys or bladder?: No Painful  intercourse?: No Weak stream?: No Currently pregnant?: No Vaginal bleeding?: No Last menstrual period?: n  Gastrointestinal Nausea?: No Vomiting?: No Indigestion/heartburn?: No Diarrhea?: No Constipation?: No  Constitutional Fever: No Night sweats?: No Weight loss?: No Fatigue?: No  Skin Skin rash/lesions?: No Itching?: No  Eyes Blurred vision?: No Double vision?: No  Ears/Nose/Throat Sore throat?: No Sinus problems?: No  Hematologic/Lymphatic Swollen glands?: No Easy bruising?: No  Cardiovascular Leg swelling?: Yes Chest pain?: No  Respiratory Cough?: No Shortness of breath?: Yes  Endocrine Excessive thirst?: No  Musculoskeletal Back pain?: No Joint pain?: No  Neurological Headaches?: No Dizziness?: No  Psychologic Depression?: No  Anxiety?: Yes  Physical Exam: BP 127/72 mmHg  Pulse 80  Resp 16  Ht 5\' 6"  (1.676 m)  Wt 211 lb 4.8 oz (95.845 kg)  BMI 34.12 kg/m2  Constitutional:  Alert and oriented, No acute distress. HEENT: [place] AT, moist mucus membranes.  Trachea midline, no masses. Respiratory: Normal respiratory effort, no increased work of breathing. GI: Abdomen is soft, nontender, nondistended, no abdominal masses. Obese. GU: No CVA tenderness.  Skin: No rashes, bruises or suspicious lesions. Neurologic: Grossly intact, no focal deficits, moving all 4 extremities. Psychiatric: Normal mood and affect.  Laboratory Data: Cr 1.2  Urinalysis Results for orders placed or performed in visit on 07/03/15  Microscopic Examination  Result Value Ref Range   WBC, UA 0-5 0 -  5 /hpf   RBC, UA 0-2 0 -  2 /hpf   Epithelial Cells (non renal) 0-10 0 - 10 /hpf   Bacteria, UA None seen None seen/Few  Urinalysis, Complete  Result Value Ref Range   Specific Gravity, UA 1.015 1.005 - 1.030   pH, UA 6.0 5.0 - 7.5   Color, UA Yellow Yellow   Appearance Ur Cloudy (A) Clear   Leukocytes, UA Negative Negative   Protein, UA Negative Negative/Trace    Glucose, UA Negative Negative   Ketones, UA Negative Negative   RBC, UA Negative Negative   Bilirubin, UA Negative Negative   Urobilinogen, Ur 0.2 0.2 - 1.0 mg/dL   Nitrite, UA Negative Negative   Microscopic Examination See below:      Pertinent Imaging: RUS negative for GU pathology  CLINICAL DATA: Recurrent urinary tract infection.  EXAM: CT ABDOMEN AND PELVIS WITHOUT CONTRAST  TECHNIQUE: Multidetector CT imaging of the abdomen and pelvis was performed following the standard protocol without IV contrast.  COMPARISON: 8/31/6  FINDINGS: Lower chest: No pleural effusion identified. The lung bases are clear.  Hepatobiliary: No suspicious liver abnormality. The gallbladder appears normal. No biliary dilatation.  Pancreas: The pancreas is unremarkable.  Spleen: The spleen appears normal.  Adrenals/Urinary Tract: Normal adrenal glands. Mild bilateral renal cortical irregularity compatible with scarring due to chronic pyelonephritis. No biliary dilatation. The bladder detail is significantly diminished secondary to beam hardening artifact from bilateral hip arthroplasty devices.  Stomach/Bowel: The stomach and small bowel loops are unremarkable. No dilated loops of small bowel or air-fluid levels identified. The colon is unremarkable. No pathologic dilatation of the large or small bowel loops.  Vascular/Lymphatic: Calcified atherosclerotic disease involves the abdominal aorta. No aneurysm. No enlarged retroperitoneal or mesenteric adenopathy. No enlarged pelvic or inguinal lymph nodes.  Reproductive: The patient is status post hysterectomy. No adnexal mass noted.  Other: No significant free fluid or fluid collections within the abdomen or pelvis.  Musculoskeletal: No aggressive lytic or sclerotic bone lesions identified. There is degenerative disc disease noted at L1-2, L2-3 and L3-4.  IMPRESSION: 1. No findings identified to explain patient's  urinary tract infections. 2. Urinary bladder detail is diminished due to presence of beam hardening artifact from both hip arthroplasty devices. 3. Bilateral renal cortical scarring. 4. Aortic atherosclerosis.   Electronically Signed  By: Kerby Moors M.D.  On: 06/28/2015 12:33h  Assessment & Plan:    1. Recurrent UTI- Patient states that she has been using her vaginal estrogen cream as recommended. UTI prevention strategies reiterated today. Will continue methenamine.  - Urinalysis, Complete -Urine Culture  2. Mixed stress and urge urinary incontinence Minimal bother. Recommend continuation of Kegal exercises and weight loss.  3. Prolapse of female pelvic  organs Managed by Dr. Lelon Huh at [facility].   Return in about 3 months (around 09/30/2015) for with [facility] .   Herbert Moors, [facility] [address] [place], [place] [address] [phone]

## 2015-07-03 NOTE — Patient Instructions (Addendum)
Urinary Tract Infection Prevention Patient Education Stay Hydrated: Urinary tract infections (UTIs) are less likely to occur in someone who is drinking enough water to promote regular urination, so it is very important to stay hydrated in order to help flush out bacteria from the urinary tract. Respond to "Nature's Call": It is always a good idea to urinate as soon as you feel the need. While "holding it in" does not directly cause an infection, it can cause overdistension that can damage the lining of the bladder, making it more vulnerable to bacteria. Remove Tampons Before Going: Remember to always take out tampons before urinating, and change tampons often.  Practice Proper Bathroom Hygiene: To keep bacteria near the urethral opening to a minimum, it is important to practice proper wiping techniques (i.e. front to back wiping) to help prevent rectal bacteria from entering the uretro-genital area. It can also be helpful to take showers and avoid soaking in the bathtub.  Take a Vitamin C Supplement: About 1,000 milligrams of vitamin C taken daily can help inhibit the growth of some bacteria by acidifying the urine. Maintain Control with Cranberries: Cranberries contain hippuronic acid, which is a natural antiseptic that may help prevent the adherence of bacteria to the bladder lining. Drinking 100% pure cranberry juice or taking over the counter cranberry supplements twice daily may help to prevent an infection. However, it is important to note that cranberry juices/supplements are not helpful once a urinary tract infection (UTI) is present. Strengthen Your Core: Often, a lazy bladder (unable to empty urine properly) occurs due to lower back problem, so consider doing exercises to help strengthen your back, pelvic floor, and stomach muscles.  Pay Attention to Your Urine: Your urine can change color for a variety of reasons, including from the medications you  take, so pay close attention to it to monitor your overall health. One key thing to note is that if your urine is typically a darker yellow, your body is dehydrated, so you need to step up your water intake.    Interstitial Cystitis Interstitial cystitis is a condition that causes inflammation of the bladder. The bladder is a hollow organ in the lower part of your abdomen. It stores urine after the urine is made by your kidneys. With interstitial cystitis, you may have pain in the bladder area. You may also have a frequent and urgent need to urinate. The severity of interstitial cystitis can vary from person to person. You may have flare-ups of the condition, and then it may go away for a while. For many people who have this condition, it becomes a long-term problem. CAUSES The cause of this condition is not known. RISK FACTORS This condition is more likely to develop in women. SYMPTOMS Symptoms of interstitial cystitis vary, and they can change over time. Symptoms may include:  Discomfort or pain in the bladder area. This can range from mild to severe. The pain may change in intensity as the bladder fills with urine or as it empties.  Pelvic pain.  An urgent need to urinate.  Frequent urination.  Pain during sexual intercourse.  Pinpoint bleeding on the bladder wall. For women, the symptoms often get worse during menstruation. DIAGNOSIS This condition is diagnosed by evaluating your symptoms and  ruling out other causes. A physical exam will be done. Various tests may be done to rule out other conditions. Common tests include:  Urine tests.  Cystoscopy. In this test, a tool that is like a very thin telescope is used to look into your bladder.  Biopsy. This involves taking a sample of tissue from the bladder wall to be examined under a microscope. TREATMENT There is no cure for interstitial cystitis, but treatment methods are available to control your symptoms. Work closely with your  health care provider to find the treatments that will be most effective for you. Treatment options may include:  Medicines to relieve pain and to help reduce the number of times that you feel the need to urinate.  Bladder training. This involves learning ways to control when you urinate, such as:  Urinating at scheduled times.  Training yourself to delay urination.  Doing exercises (Kegel exercises) to strengthen the muscles that control urine flow.  Lifestyle changes, such as changing your diet or taking steps to control stress.  Use of a device that provides electrical stimulation in order to reduce pain.  A procedure that stretches your bladder by filling it with air or fluid.  Surgery. This is rare. It is only done for extreme cases if other treatments do not help. HOME CARE INSTRUCTIONS  Take medicines only as directed by your health care provider.  Use bladder training techniques as directed.  Keep a bladder diary to find out which foods, liquids, or activities make your symptoms worse.  Use your bladder diary to schedule bathroom trips. If you are away from home, plan to be near a bathroom at each of your scheduled times.  Make sure you urinate just before you leave the house and just before you go to bed.  Do Kegel exercises as directed by your health care provider.  Do not drink alcohol.  Do not use any tobacco products, including cigarettes, chewing tobacco, or electronic cigarettes. If you need help quitting, ask your health care provider.  Make dietary changes as directed by your health care provider. You may need to avoid spicy foods and foods that contain a high amount of potassium.  Limit your drinking of beverages that stimulate urination. These include soda, coffee, and tea.  Keep all follow-up visits as directed by your health care provider. This is important. SEEK MEDICAL CARE IF:  Your symptoms do not get better after treatment.  Your pain and  discomfort are getting worse.  You have more frequent urges to urinate.  You have a fever. SEEK IMMEDIATE MEDICAL CARE IF:  You are not able to control your bladder at all.   This information is not intended to replace advice given to you by your health care provider. Make sure you discuss any questions you have with your health care provider.   Document Released: 12/27/2003 Document Revised: 05/18/2014 Document Reviewed: 01/02/2014 Elsevier Interactive Patient Education [employer].

## 2015-09-30 ENCOUNTER — Ambulatory Visit: Payer: Medicare PPO | Admitting: Urology

## 2016-02-11 ENCOUNTER — Encounter: Payer: Self-pay | Admitting: *Deleted

## 2016-02-11 ENCOUNTER — Telehealth: Payer: Self-pay | Admitting: *Deleted

## 2016-02-11 NOTE — Telephone Encounter (Signed)
Notified patient that annual lung cancer screening low dose CT scan is due. Confirmed that patient is within the age range of 55-77, and asymptomatic, (no signs or symptoms of lung cancer). Patient denies illness that would prevent curative treatment for lung cancer if found. The patient is a current smoker, with a 31.5 pack year history. The shared decision making visit was done 02/09/14. Patient is agreeable for CT scan being scheduled.

## 2016-02-20 ENCOUNTER — Other Ambulatory Visit: Payer: Self-pay | Admitting: *Deleted

## 2016-02-20 DIAGNOSIS — Z87891 Personal history of nicotine dependence: Secondary | ICD-10-CM

## 2016-03-04 ENCOUNTER — Ambulatory Visit: Payer: Medicare PPO

## 2016-03-13 ENCOUNTER — Ambulatory Visit: Payer: Medicare PPO

## 2016-03-17 ENCOUNTER — Ambulatory Visit
Admission: RE | Admit: 2016-03-17 | Discharge: 2016-03-17 | Disposition: A | Discharge status: Home / Self Care | Payer: Medicare PPO | Source: Ambulatory Visit | Attending: Oncology | Admitting: Oncology

## 2016-03-17 DIAGNOSIS — J439 Emphysema, unspecified: Secondary | ICD-10-CM | POA: Diagnosis not present

## 2016-03-17 DIAGNOSIS — Z122 Encounter for screening for malignant neoplasm of respiratory organs: Secondary | ICD-10-CM | POA: Diagnosis present

## 2016-03-17 DIAGNOSIS — Z87891 Personal history of nicotine dependence: Secondary | ICD-10-CM | POA: Insufficient documentation

## 2016-03-17 DIAGNOSIS — I2510 Atherosclerotic heart disease of native coronary artery without angina pectoris: Secondary | ICD-10-CM | POA: Diagnosis not present

## 2016-03-17 DIAGNOSIS — I700 Atherosclerosis of aorta: Secondary | ICD-10-CM | POA: Diagnosis not present

## 2016-03-18 ENCOUNTER — Encounter: Payer: Self-pay | Admitting: *Deleted

## 2016-03-23 ENCOUNTER — Other Ambulatory Visit: Payer: Self-pay | Admitting: Nurse Practitioner

## 2016-03-23 DIAGNOSIS — Z1231 Encounter for screening mammogram for malignant neoplasm of breast: Secondary | ICD-10-CM

## 2016-04-16 ENCOUNTER — Other Ambulatory Visit: Payer: Self-pay | Admitting: Nurse Practitioner

## 2016-04-16 ENCOUNTER — Ambulatory Visit
Admission: RE | Admit: 2016-04-16 | Discharge: 2016-04-16 | Disposition: A | Discharge status: Home / Self Care | Payer: Medicare PPO | Source: Ambulatory Visit | Attending: Nurse Practitioner | Admitting: Nurse Practitioner

## 2016-04-16 DIAGNOSIS — Z1231 Encounter for screening mammogram for malignant neoplasm of breast: Secondary | ICD-10-CM

## 2016-05-21 ENCOUNTER — Ambulatory Visit: Payer: Medicare PPO | Admitting: Urology

## 2017-03-22 ENCOUNTER — Telehealth: Payer: Self-pay | Admitting: *Deleted

## 2017-03-22 DIAGNOSIS — Z122 Encounter for screening for malignant neoplasm of respiratory organs: Secondary | ICD-10-CM

## 2017-03-22 DIAGNOSIS — Z87891 Personal history of nicotine dependence: Secondary | ICD-10-CM

## 2017-03-22 NOTE — Telephone Encounter (Signed)
Notified patient that annual lung cancer screening low dose CT scan is due currently or will be in near future. Confirmed that patient is within the age range of 55-77, and asymptomatic, (no signs or symptoms of lung cancer). Patient denies illness that would prevent curative treatment for lung cancer if found. Verified smoking history, (current, 32 pack year). The shared decision making visit was done 02/09/14. Patient is agreeable for CT scan being scheduled.

## 2017-03-29 ENCOUNTER — Ambulatory Visit
Admission: RE | Admit: 2017-03-29 | Discharge: 2017-03-29 | Disposition: A | Discharge status: Home / Self Care | Payer: Medicare PPO | Source: Ambulatory Visit | Attending: Nurse Practitioner | Admitting: Nurse Practitioner

## 2017-03-29 DIAGNOSIS — I700 Atherosclerosis of aorta: Secondary | ICD-10-CM | POA: Diagnosis not present

## 2017-03-29 DIAGNOSIS — Z87891 Personal history of nicotine dependence: Secondary | ICD-10-CM | POA: Diagnosis not present

## 2017-03-29 DIAGNOSIS — Z122 Encounter for screening for malignant neoplasm of respiratory organs: Secondary | ICD-10-CM | POA: Diagnosis present

## 2017-04-05 ENCOUNTER — Other Ambulatory Visit: Payer: Self-pay | Admitting: Nurse Practitioner

## 2017-04-05 DIAGNOSIS — Z1231 Encounter for screening mammogram for malignant neoplasm of breast: Secondary | ICD-10-CM

## 2017-04-06 ENCOUNTER — Encounter: Payer: Self-pay | Admitting: *Deleted

## 2017-04-20 ENCOUNTER — Ambulatory Visit: Payer: Medicare PPO

## 2017-04-22 ENCOUNTER — Ambulatory Visit: Payer: Medicare PPO

## 2017-05-13 ENCOUNTER — Ambulatory Visit
Admission: RE | Admit: 2017-05-13 | Discharge: 2017-05-13 | Disposition: A | Discharge status: Home / Self Care | Payer: Medicare Other | Source: Ambulatory Visit | Attending: Nurse Practitioner | Admitting: Nurse Practitioner

## 2017-05-13 DIAGNOSIS — Z1231 Encounter for screening mammogram for malignant neoplasm of breast: Secondary | ICD-10-CM | POA: Diagnosis not present

## 2017-05-14 ENCOUNTER — Other Ambulatory Visit: Payer: Self-pay | Admitting: Nurse Practitioner

## 2017-05-14 DIAGNOSIS — N6310 Unspecified lump in the right breast, unspecified quadrant: Secondary | ICD-10-CM

## 2017-05-14 DIAGNOSIS — R928 Other abnormal and inconclusive findings on diagnostic imaging of breast: Secondary | ICD-10-CM

## 2017-05-24 ENCOUNTER — Ambulatory Visit
Admission: RE | Admit: 2017-05-24 | Discharge: 2017-05-24 | Disposition: A | Discharge status: Home / Self Care | Payer: Medicare Other | Source: Ambulatory Visit | Attending: Nurse Practitioner | Admitting: Nurse Practitioner

## 2017-05-24 DIAGNOSIS — N6002 Solitary cyst of left breast: Secondary | ICD-10-CM | POA: Diagnosis not present

## 2017-05-24 DIAGNOSIS — R928 Other abnormal and inconclusive findings on diagnostic imaging of breast: Secondary | ICD-10-CM

## 2017-05-24 DIAGNOSIS — N6310 Unspecified lump in the right breast, unspecified quadrant: Secondary | ICD-10-CM

## 2017-05-31 ENCOUNTER — Other Ambulatory Visit: Payer: Self-pay

## 2017-05-31 ENCOUNTER — Encounter: Payer: Self-pay | Admitting: Emergency Medicine

## 2017-05-31 ENCOUNTER — Emergency Department: Payer: Medicare Other

## 2017-05-31 ENCOUNTER — Encounter
Admission: EM | Disposition: A | Discharge status: Home / Self Care | Payer: Self-pay | Source: Home / Self Care | Attending: Emergency Medicine

## 2017-05-31 ENCOUNTER — Emergency Department: Payer: Medicare Other | Admitting: Anesthesiology

## 2017-05-31 ENCOUNTER — Observation Stay
Admission: EM | Admit: 2017-05-31 | Discharge: 2017-06-01 | Disposition: A | Discharge status: Home / Self Care | Payer: Medicare Other | Attending: Orthopedic Surgery | Admitting: Orthopedic Surgery

## 2017-05-31 DIAGNOSIS — K219 Gastro-esophageal reflux disease without esophagitis: Secondary | ICD-10-CM | POA: Insufficient documentation

## 2017-05-31 DIAGNOSIS — S73004A Unspecified dislocation of right hip, initial encounter: Secondary | ICD-10-CM

## 2017-05-31 DIAGNOSIS — Z8042 Family history of malignant neoplasm of prostate: Secondary | ICD-10-CM | POA: Insufficient documentation

## 2017-05-31 DIAGNOSIS — Z803 Family history of malignant neoplasm of breast: Secondary | ICD-10-CM | POA: Diagnosis not present

## 2017-05-31 DIAGNOSIS — F17210 Nicotine dependence, cigarettes, uncomplicated: Secondary | ICD-10-CM | POA: Diagnosis not present

## 2017-05-31 DIAGNOSIS — I129 Hypertensive chronic kidney disease with stage 1 through stage 4 chronic kidney disease, or unspecified chronic kidney disease: Secondary | ICD-10-CM | POA: Insufficient documentation

## 2017-05-31 DIAGNOSIS — Y92019 Unspecified place in single-family (private) house as the place of occurrence of the external cause: Secondary | ICD-10-CM | POA: Insufficient documentation

## 2017-05-31 DIAGNOSIS — M79604 Pain in right leg: Secondary | ICD-10-CM | POA: Diagnosis present

## 2017-05-31 DIAGNOSIS — Z811 Family history of alcohol abuse and dependence: Secondary | ICD-10-CM | POA: Insufficient documentation

## 2017-05-31 DIAGNOSIS — Z7982 Long term (current) use of aspirin: Secondary | ICD-10-CM | POA: Insufficient documentation

## 2017-05-31 DIAGNOSIS — N183 Chronic kidney disease, stage 3 (moderate): Secondary | ICD-10-CM | POA: Insufficient documentation

## 2017-05-31 DIAGNOSIS — Z87828 Personal history of other (healed) physical injury and trauma: Secondary | ICD-10-CM

## 2017-05-31 DIAGNOSIS — Z85828 Personal history of other malignant neoplasm of skin: Secondary | ICD-10-CM | POA: Diagnosis not present

## 2017-05-31 DIAGNOSIS — W19XXXA Unspecified fall, initial encounter: Secondary | ICD-10-CM

## 2017-05-31 DIAGNOSIS — T84029A Dislocation of unspecified internal joint prosthesis, initial encounter: Secondary | ICD-10-CM

## 2017-05-31 DIAGNOSIS — E7849 Other hyperlipidemia: Secondary | ICD-10-CM | POA: Insufficient documentation

## 2017-05-31 DIAGNOSIS — Z8052 Family history of malignant neoplasm of bladder: Secondary | ICD-10-CM | POA: Diagnosis not present

## 2017-05-31 DIAGNOSIS — Z8739 Personal history of other diseases of the musculoskeletal system and connective tissue: Secondary | ICD-10-CM

## 2017-05-31 DIAGNOSIS — S73005A Unspecified dislocation of left hip, initial encounter: Secondary | ICD-10-CM

## 2017-05-31 DIAGNOSIS — Z79899 Other long term (current) drug therapy: Secondary | ICD-10-CM | POA: Insufficient documentation

## 2017-05-31 DIAGNOSIS — T84020A Dislocation of internal right hip prosthesis, initial encounter: Principal | ICD-10-CM | POA: Insufficient documentation

## 2017-05-31 DIAGNOSIS — Z96643 Presence of artificial hip joint, bilateral: Secondary | ICD-10-CM | POA: Insufficient documentation

## 2017-05-31 DIAGNOSIS — N3946 Mixed incontinence: Secondary | ICD-10-CM | POA: Diagnosis not present

## 2017-05-31 DIAGNOSIS — Z96649 Presence of unspecified artificial hip joint: Secondary | ICD-10-CM

## 2017-05-31 HISTORY — PX: HIP CLOSED REDUCTION: SHX983

## 2017-05-31 LAB — CBC WITH DIFFERENTIAL/PLATELET
BASOS ABS: 0.1 10*3/uL (ref 0–0.1)
BASOS PCT: 1 %
Eosinophils Absolute: 0 10*3/uL (ref 0–0.7)
Eosinophils Relative: 0 %
HEMATOCRIT: 44.3 % (ref 35.0–47.0)
HEMOGLOBIN: 14.5 g/dL (ref 12.0–16.0)
LYMPHS PCT: 6 %
Lymphs Abs: 0.8 10*3/uL — ABNORMAL LOW (ref 1.0–3.6)
MCH: 30.4 pg (ref 26.0–34.0)
MCHC: 32.8 g/dL (ref 32.0–36.0)
MCV: 92.7 fL (ref 80.0–100.0)
MONO ABS: 1 10*3/uL — AB (ref 0.2–0.9)
MONOS PCT: 7 %
NEUTROS ABS: 11.3 10*3/uL — AB (ref 1.4–6.5)
NEUTROS PCT: 86 %
Platelets: 194 10*3/uL (ref 150–440)
RBC: 4.78 MIL/uL (ref 3.80–5.20)
RDW: 13.9 % (ref 11.5–14.5)
WBC: 13.3 10*3/uL — ABNORMAL HIGH (ref 3.6–11.0)

## 2017-05-31 LAB — COMPREHENSIVE METABOLIC PANEL
ALBUMIN: 4.3 g/dL (ref 3.5–5.0)
ALK PHOS: 63 U/L (ref 38–126)
ALT: 14 U/L (ref 14–54)
AST: 24 U/L (ref 15–41)
Anion gap: 9 (ref 5–15)
BUN: 25 mg/dL — AB (ref 6–20)
CALCIUM: 9.4 mg/dL (ref 8.9–10.3)
CO2: 22 mmol/L (ref 22–32)
Chloride: 103 mmol/L (ref 101–111)
Creatinine, Ser: 1.58 mg/dL — ABNORMAL HIGH (ref 0.44–1.00)
GFR calc Af Amer: 37 mL/min — ABNORMAL LOW (ref >60)
GFR, EST NON AFRICAN AMERICAN: 32 mL/min — AB (ref >60)
GLUCOSE: 150 mg/dL — AB (ref 65–99)
Potassium: 4.5 mmol/L (ref 3.5–5.1)
Sodium: 134 mmol/L — ABNORMAL LOW (ref 135–145)
TOTAL PROTEIN: 7.9 g/dL (ref 6.5–8.1)
Total Bilirubin: 1.5 mg/dL — ABNORMAL HIGH (ref 0.3–1.2)

## 2017-05-31 SURGERY — CLOSED REDUCTION, HIP
Anesthesia: General | Site: Hip | Laterality: Right

## 2017-05-31 MED ORDER — ONDANSETRON HCL 4 MG/2ML IJ SOLN
INTRAMUSCULAR | Status: AC
  Administered 2017-05-31: 4 mg via INTRAVENOUS
  Filled 2017-05-31: qty 2

## 2017-05-31 MED ORDER — MIDAZOLAM HCL 5 MG/5ML IJ SOLN
INTRAMUSCULAR | Status: AC | PRN
  Administered 2017-05-31: 2 mg via INTRAVENOUS

## 2017-05-31 MED ORDER — PANTOPRAZOLE SODIUM 40 MG PO TBEC
80.0000 mg | DELAYED_RELEASE_TABLET | Freq: Every day | ORAL | Status: DC
  Administered 2017-06-01: 80 mg via ORAL
  Filled 2017-05-31: qty 2

## 2017-05-31 MED ORDER — BUPROPION HCL ER (XL) 150 MG PO TB24
300.0000 mg | ORAL_TABLET | Freq: Every day | ORAL | Status: DC
  Administered 2017-06-01: 300 mg via ORAL
  Filled 2017-05-31: qty 2

## 2017-05-31 MED ORDER — LIDOCAINE HCL (PF) 2 % IJ SOLN
INTRAMUSCULAR | Status: AC
  Filled 2017-05-31: qty 10

## 2017-05-31 MED ORDER — ONDANSETRON HCL 4 MG/2ML IJ SOLN
INTRAMUSCULAR | Status: DC | PRN
  Administered 2017-05-31: 4 mg via INTRAVENOUS

## 2017-05-31 MED ORDER — MORPHINE SULFATE (PF) 4 MG/ML IV SOLN
INTRAVENOUS | Status: AC
  Administered 2017-05-31: 4 mg via INTRAVENOUS
  Filled 2017-05-31: qty 1

## 2017-05-31 MED ORDER — ONDANSETRON HCL 4 MG/2ML IJ SOLN: 4.0000 mg | Freq: Four times a day (QID) | INTRAMUSCULAR | Status: DC | PRN

## 2017-05-31 MED ORDER — ONDANSETRON 4 MG PO TBDP
4.0000 mg | ORAL_TABLET | Freq: Every day | ORAL | Status: DC
  Administered 2017-06-01: 4 mg via ORAL
  Filled 2017-05-31: qty 1

## 2017-05-31 MED ORDER — ASPIRIN EC 81 MG PO TBEC
81.0000 mg | DELAYED_RELEASE_TABLET | Freq: Every day | ORAL | Status: DC
  Administered 2017-06-01: 81 mg via ORAL
  Filled 2017-05-31: qty 1

## 2017-05-31 MED ORDER — OXYCODONE HCL 5 MG PO TABS: 5.0000 mg | ORAL_TABLET | Freq: Once | ORAL | Status: DC | PRN

## 2017-05-31 MED ORDER — LACTATED RINGERS IV SOLN
INTRAVENOUS | Status: DC | PRN
  Administered 2017-05-31: 20:00:00 via INTRAVENOUS

## 2017-05-31 MED ORDER — OXYCODONE HCL 5 MG PO TABS
5.0000 mg | ORAL_TABLET | ORAL | Status: DC | PRN
  Administered 2017-06-01 (×2): 5 mg via ORAL
  Filled 2017-05-31 (×2): qty 1

## 2017-05-31 MED ORDER — BISACODYL 10 MG RE SUPP: 10.0000 mg | Freq: Every day | RECTAL | Status: DC | PRN

## 2017-05-31 MED ORDER — DOCUSATE SODIUM 100 MG PO CAPS
100.0000 mg | ORAL_CAPSULE | Freq: Every day | ORAL | Status: DC
  Administered 2017-06-01: 100 mg via ORAL
  Filled 2017-05-31: qty 1

## 2017-05-31 MED ORDER — FLUMAZENIL 0.5 MG/5ML IV SOLN
INTRAVENOUS | Status: AC | PRN
  Administered 2017-05-31 (×2): .17 mg via INTRAVENOUS

## 2017-05-31 MED ORDER — FENTANYL CITRATE (PF) 100 MCG/2ML IJ SOLN
INTRAMUSCULAR | Status: DC | PRN
  Administered 2017-05-31 (×2): 25 ug via INTRAVENOUS

## 2017-05-31 MED ORDER — CYCLOBENZAPRINE HCL 10 MG PO TABS
5.0000 mg | ORAL_TABLET | Freq: Three times a day (TID) | ORAL | Status: DC
  Administered 2017-05-31 – 2017-06-01 (×2): 5 mg via ORAL
  Filled 2017-05-31 (×2): qty 1

## 2017-05-31 MED ORDER — MAGNESIUM 250 MG PO TABS
250.0000 mg | ORAL_TABLET | Freq: Every day | ORAL | Status: DC
  Filled 2017-05-31: qty 1

## 2017-05-31 MED ORDER — METOCLOPRAMIDE HCL 5 MG/ML IJ SOLN: 5.0000 mg | Freq: Three times a day (TID) | INTRAMUSCULAR | Status: DC | PRN

## 2017-05-31 MED ORDER — FENTANYL CITRATE (PF) 100 MCG/2ML IJ SOLN
INTRAMUSCULAR | Status: AC | PRN
  Administered 2017-05-31 (×2): 25 ug via INTRAVENOUS
  Administered 2017-05-31: 100 ug via INTRAVENOUS

## 2017-05-31 MED ORDER — FLUMAZENIL 0.5 MG/5ML IV SOLN
INTRAVENOUS | Status: AC
  Filled 2017-05-31: qty 5

## 2017-05-31 MED ORDER — OXYCODONE HCL 5 MG PO TABS: 10.0000 mg | ORAL_TABLET | ORAL | Status: DC | PRN

## 2017-05-31 MED ORDER — PROPOFOL 10 MG/ML IV BOLUS
INTRAVENOUS | Status: AC
  Filled 2017-05-31: qty 20

## 2017-05-31 MED ORDER — FENTANYL CITRATE (PF) 100 MCG/2ML IJ SOLN
INTRAMUSCULAR | Status: AC
  Filled 2017-05-31: qty 4

## 2017-05-31 MED ORDER — DIPHENHYDRAMINE HCL 12.5 MG/5ML PO ELIX: 12.5000 mg | ORAL_SOLUTION | ORAL | Status: DC | PRN

## 2017-05-31 MED ORDER — SODIUM CHLORIDE 0.9 % IV SOLN
Freq: Once | INTRAVENOUS | Status: AC
  Administered 2017-05-31: 15:00:00 via INTRAVENOUS

## 2017-05-31 MED ORDER — ONDANSETRON HCL 4 MG/2ML IJ SOLN
4.0000 mg | Freq: Once | INTRAMUSCULAR | Status: AC
  Administered 2017-05-31: 4 mg via INTRAVENOUS

## 2017-05-31 MED ORDER — POTASSIUM CHLORIDE CRYS ER 10 MEQ PO TBCR
10.0000 meq | EXTENDED_RELEASE_TABLET | Freq: Every day | ORAL | Status: DC
  Administered 2017-06-01: 10 meq via ORAL
  Filled 2017-05-31: qty 1

## 2017-05-31 MED ORDER — NALOXONE HCL 2 MG/2ML IJ SOSY
PREFILLED_SYRINGE | INTRAMUSCULAR | Status: AC
  Filled 2017-05-31: qty 2

## 2017-05-31 MED ORDER — NALOXONE HCL 0.4 MG/ML IJ SOLN
INTRAMUSCULAR | Status: AC | PRN
  Administered 2017-05-31: .5 mg via INTRAVENOUS

## 2017-05-31 MED ORDER — CITALOPRAM HYDROBROMIDE 20 MG PO TABS
40.0000 mg | ORAL_TABLET | Freq: Every day | ORAL | Status: DC
  Filled 2017-05-31: qty 2

## 2017-05-31 MED ORDER — VITAMIN D (ERGOCALCIFEROL) 1.25 MG (50000 UNIT) PO CAPS
50000.0000 [IU] | ORAL_CAPSULE | ORAL | Status: DC
  Filled 2017-05-31: qty 1

## 2017-05-31 MED ORDER — PHENOL 1.4 % MT LIQD
1.0000 | OROMUCOSAL | Status: DC | PRN
  Filled 2017-05-31: qty 177

## 2017-05-31 MED ORDER — ONDANSETRON HCL 4 MG PO TABS: 4.0000 mg | ORAL_TABLET | Freq: Four times a day (QID) | ORAL | Status: DC | PRN

## 2017-05-31 MED ORDER — DULOXETINE HCL 60 MG PO CPEP
60.0000 mg | ORAL_CAPSULE | Freq: Every day | ORAL | Status: DC
  Administered 2017-06-01: 60 mg via ORAL
  Filled 2017-05-31: qty 1

## 2017-05-31 MED ORDER — MIDAZOLAM HCL 2 MG/2ML IJ SOLN
INTRAMUSCULAR | Status: AC | PRN
  Administered 2017-05-31: 2 mg via INTRAVENOUS

## 2017-05-31 MED ORDER — MORPHINE SULFATE (PF) 4 MG/ML IV SOLN
4.0000 mg | Freq: Once | INTRAVENOUS | Status: AC
Start: 2017-05-31 — End: 2017-05-31
  Administered 2017-05-31: 4 mg via INTRAVENOUS

## 2017-05-31 MED ORDER — ACETAMINOPHEN 500 MG PO TABS
1000.0000 mg | ORAL_TABLET | Freq: Three times a day (TID) | ORAL | Status: DC
  Administered 2017-05-31 – 2017-06-01 (×2): 1000 mg via ORAL
  Filled 2017-05-31 (×2): qty 2

## 2017-05-31 MED ORDER — FENTANYL CITRATE (PF) 100 MCG/2ML IJ SOLN: 25.0000 ug | INTRAMUSCULAR | Status: DC | PRN

## 2017-05-31 MED ORDER — HYDROMORPHONE HCL 1 MG/ML IJ SOLN: 0.5000 mg | INTRAMUSCULAR | Status: DC | PRN

## 2017-05-31 MED ORDER — MENTHOL 3 MG MT LOZG
1.0000 | LOZENGE | OROMUCOSAL | Status: DC | PRN
  Filled 2017-05-31: qty 9

## 2017-05-31 MED ORDER — SENNOSIDES-DOCUSATE SODIUM 8.6-50 MG PO TABS: 1.0000 | ORAL_TABLET | Freq: Every evening | ORAL | Status: DC | PRN

## 2017-05-31 MED ORDER — IPRATROPIUM-ALBUTEROL 0.5-2.5 (3) MG/3ML IN SOLN
RESPIRATORY_TRACT | Status: AC
  Administered 2017-05-31: 3 mL via RESPIRATORY_TRACT
  Filled 2017-05-31: qty 3

## 2017-05-31 MED ORDER — OXYCODONE HCL 5 MG/5ML PO SOLN: 5.0000 mg | Freq: Once | ORAL | Status: DC | PRN

## 2017-05-31 MED ORDER — MIDAZOLAM HCL 5 MG/5ML IJ SOLN
INTRAMUSCULAR | Status: AC
  Filled 2017-05-31: qty 10

## 2017-05-31 MED ORDER — MORPHINE SULFATE (PF) 4 MG/ML IV SOLN
4.0000 mg | Freq: Once | INTRAVENOUS | Status: AC
  Administered 2017-05-31: 4 mg via INTRAVENOUS

## 2017-05-31 MED ORDER — METOCLOPRAMIDE HCL 10 MG PO TABS: 5.0000 mg | ORAL_TABLET | Freq: Three times a day (TID) | ORAL | Status: DC | PRN

## 2017-05-31 MED ORDER — SUCCINYLCHOLINE CHLORIDE 20 MG/ML IJ SOLN
INTRAMUSCULAR | Status: DC | PRN
  Administered 2017-05-31: 100 mg via INTRAVENOUS

## 2017-05-31 MED ORDER — ENOXAPARIN SODIUM 40 MG/0.4ML ~~LOC~~ SOLN
40.0000 mg | SUBCUTANEOUS | Status: DC
  Administered 2017-06-01: 40 mg via SUBCUTANEOUS
  Filled 2017-05-31: qty 0.4

## 2017-05-31 MED ORDER — IPRATROPIUM-ALBUTEROL 0.5-2.5 (3) MG/3ML IN SOLN
3.0000 mL | Freq: Once | RESPIRATORY_TRACT | Status: DC | PRN
  Administered 2017-05-31: 3 mL via RESPIRATORY_TRACT

## 2017-05-31 MED ORDER — SIMVASTATIN 20 MG PO TABS
20.0000 mg | ORAL_TABLET | Freq: Every day | ORAL | Status: DC
  Administered 2017-05-31: 20 mg via ORAL
  Filled 2017-05-31: qty 1

## 2017-05-31 MED ORDER — LIDOCAINE HCL (CARDIAC) 20 MG/ML IV SOLN
INTRAVENOUS | Status: DC | PRN
  Administered 2017-05-31: 60 mg via INTRAVENOUS

## 2017-05-31 MED ORDER — PROPOFOL 10 MG/ML IV BOLUS
INTRAVENOUS | Status: DC | PRN
  Administered 2017-05-31: 150 mg via INTRAVENOUS
  Administered 2017-05-31: 50 mg via INTRAVENOUS

## 2017-05-31 MED ORDER — FENTANYL CITRATE (PF) 100 MCG/2ML IJ SOLN
INTRAMUSCULAR | Status: AC
  Filled 2017-05-31: qty 2

## 2017-05-31 MED ORDER — METHENAMINE HIPPURATE 1 G PO TABS
1.0000 g | ORAL_TABLET | Freq: Two times a day (BID) | ORAL | Status: DC
  Filled 2017-05-31 (×2): qty 1

## 2017-05-31 SURGICAL SUPPLY — 1 items: KIT RM TURNOVER STRD PROC AR (KITS) ×3 IMPLANT

## 2017-05-31 NOTE — Anesthesia Postprocedure Evaluation (Signed)
Anesthesia Post Note  Patient: Caroleann Casler Rogala  Procedure(s) Performed: CLOSED REDUCTION HIP (Right Hip)  Patient location during evaluation: PACU Anesthesia Type: General Level of consciousness: awake and alert Pain management: pain level controlled Vital Signs Assessment: post-procedure vital signs reviewed and stable Respiratory status: spontaneous breathing, nonlabored ventilation, respiratory function stable and patient connected to nasal cannula oxygen Cardiovascular status: blood pressure returned to baseline and stable Postop Assessment: no apparent nausea or vomiting Anesthetic complications: no     Last Vitals:  Vitals:   05/31/17 2137 05/31/17 2210  BP: 133/77 (!) 149/67  Pulse: 82 81  Resp: 19 16  Temp: 37 C 36.9 C  SpO2: 97% 97%    Last Pain:  Vitals:   05/31/17 2210  TempSrc: Oral  PainSc:                  Precious Haws Diontae Route

## 2017-05-31 NOTE — Anesthesia Procedure Notes (Signed)
Procedure Name: Intubation Date/Time: 05/31/2017 8:29 PM Performed by: Lendon Colonel, CRNA Pre-anesthesia Checklist: Patient identified, Patient being monitored, Timeout performed, Emergency Drugs available and Suction available Patient Re-evaluated:Patient Re-evaluated prior to induction Oxygen Delivery Method: Circle system utilized Preoxygenation: Pre-oxygenation with 100% oxygen Induction Type: IV induction Ventilation: Mask ventilation without difficulty Laryngoscope Size: Mac and 3 Grade View: Grade I Tube type: Oral Tube size: 7.0 mm Number of attempts: 1 Airway Equipment and Method: Stylet Placement Confirmation: ETT inserted through vocal cords under direct vision,  positive ETCO2 and breath sounds checked- equal and bilateral Secured at: 21 cm Tube secured with: Tape Dental Injury: Teeth and Oropharynx as per pre-operative assessment

## 2017-05-31 NOTE — Sedation Documentation (Signed)
X RAY at bedside 

## 2017-05-31 NOTE — ED Notes (Signed)
Gave report to OR. Orderly sent to get pt.

## 2017-05-31 NOTE — Anesthesia Post-op Follow-up Note (Signed)
Anesthesia QCDR form completed.        

## 2017-05-31 NOTE — ED Triage Notes (Signed)
Pt from home via ems with reports that pt was being helped to the restroom by her daughter and her rt leg gave out on her and pt fell. No head injury. Reports rt lower leg pain and rt sided pelvic pain.

## 2017-05-31 NOTE — ED Notes (Signed)
The EKG was completed and signed by Dr. Mable Paris. The EKG was also exported into the system.

## 2017-05-31 NOTE — ED Provider Notes (Signed)
[facility] Emergency Department Provider Note   ____________________________________________   First MD Initiated Contact with Patient 05/31/17 1229     (approximate)  I have reviewed the triage vital signs and the nursing notes.   HISTORY  Chief Complaint Leg Pain and Fall   HPI Darrah Dredge is a 72 y.o. female Who reports she lost her balance and fell yesterday she went to the urgent care they did some x-rays and saw compression fractures in her back in March with a new or not. Today she lost her balance again and fell this time she has a lot of pain in the right hip. 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 She is having trouble walking. The present time she does not want any pain medicines.pain is moderate. Patient has had bilateral hip replacements.  Past Medical History:  Diagnosis Date  . Acid reflux   . Cancer ([place])    skin ca  . Chronic kidney disease (CKD), stage III (moderate) ([place]) 12/09/2012  . 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 Current tobacco use 10/09/2010  . Essential (primary) hypertension 10/09/2010  . Hernia, rectovaginal 10/10/2012  . Mixed incontinence 10/10/2012  . Personal history of tobacco use, presenting hazards to health 02/05/2015    Patient Active Problem List   Diagnosis Date Noted  . Personal history of tobacco use, presenting hazards to health 02/05/2015  . Chronic kidney disease (CKD), stage III (moderate) ([place]) 12/09/2012  . Mixed incontinence 10/10/2012  . Hernia, rectovaginal 10/10/2012  . Essential (primary) hypertension 10/09/2010  . 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 Current tobacco use 10/09/2010    Past Surgical History:  Procedure Laterality Date  . ABDOMINAL HYSTERECTOMY    . HIP SURGERY    . REPLACEMENT TOTAL KNEE BILATERAL      Prior to Admission medications   Medication Sig Start Date End Date Taking? Authorizing Provider  aspirin 81  MG tablet Take 81 mg by mouth. 10/08/10  Yes [provider]  buPROPion (WELLBUTRIN XL) 300 MG 24 hr tablet Take 1 tablet by mouth daily. 05/28/17  Yes [provider]  cephALEXin (KEFLEX) 250 MG capsule Take 1 capsule by mouth daily. 05/28/17  Yes [provider]  citalopram (CELEXA) 40 MG tablet Take 40 mg by mouth daily.  11/25/07  Yes [provider]  cyclobenzaprine (FLEXERIL) 5 MG tablet Take 1 tablet by mouth 3 (three) times daily. 05/30/17  Yes [provider]  docusate sodium (STOOL SOFTENER) 100 MG capsule Take 100 mg by mouth.   Yes [provider]  DULoxetine (CYMBALTA) 60 MG capsule Take 1 capsule by mouth daily. 05/12/17  Yes [provider]  HYDROcodone-acetaminophen (NORCO/VICODIN) 5-325 MG tablet Take 1 tablet by mouth every 4 (four) hours as needed.  06/14/15  Yes [provider]  Magnesium 250 MG TABS Take 250 mg by mouth daily.  10/08/10  Yes [provider]  omeprazole (PRILOSEC) 40 MG capsule Take 40 mg by mouth daily.  09/08/12  Yes [provider]  ondansetron (ZOFRAN-ODT) 4 MG disintegrating tablet Take 1 tablet by mouth daily. 05/30/17  Yes [provider]  potassium chloride (KLOR-CON 10) 10 MEQ tablet Take 10 mEq by mouth daily.  10/08/10  Yes [provider]  simvastatin (ZOCOR) 20 MG tablet Take 20 mg by mouth at bedtime.  04/17/15  Yes [provider]  Vitamin D, Ergocalciferol, (DRISDOL) 50000 units CAPS capsule Take 50,000 Units by  mouth every 7 (seven) days.    Yes [provider]  methenamine (HIPREX) 1 g tablet Take 1 tablet (1 g total) by mouth 2 (two) times daily with a meal. Patient not taking: Reported on 07/03/2015 05/22/15   Hollice Espy, MD  nystatin cream (MYCOSTATIN) continuous as needed. 07/09/14   [provider]    Allergies Beta adrenergic blockers; Hydrochlorothiazide; Sulfathiazole; and Thiazide-type diuretics  Family History    Problem Relation Age of Onset  . Breast cancer Sister 63  . Breast cancer Maternal Aunt   . Breast cancer Cousin 18       paternal  . Prostate cancer Maternal Grandfather   . Bladder Cancer Neg Hx   . Kidney cancer Neg Hx     Social History Social History   Tobacco Use  . Smoking status: Current Every Day Smoker    Packs/day: 1.00    Years: 31.00    Pack years: 31.00    Types: Cigarettes  Substance Use Topics  . Alcohol use: Not on file  . Drug use: Not on file    Review of Systems  Constitutional: No fever/chills Eyes: No visual changes. ENT: No sore throat. Cardiovascular: Denies chest pain. Respiratory: Denies shortness of breath. Gastrointestinal: No abdominal pain.  No nausea, no vomiting.  No diarrhea.  No constipation. Genitourinary: Negative for dysuria. Musculoskeletal:hip pain Skin: Negative for rash. Neurological: Negative for headaches, focal weakness___________________   PHYSICAL EXAM:  VITAL SIGNS: ED Triage Vitals  Enc Vitals Group     BP 05/31/17 1233 (!) 152/114     Pulse Rate 05/31/17 1233 85     Resp 05/31/17 1233 18     Temp 05/31/17 1233 97.8 F (36.6 C)     Temp Source 05/31/17 1233 Oral     SpO2 05/31/17 1233 100 %     Weight 05/31/17 1230 229 lb (103.9 kg)     Height 05/31/17 1230 5\' 6"  (1.676 m)     Head Circumference --      Peak Flow --      Pain Score 05/31/17 1229 8     Pain Loc --      Pain Edu? --      Excl. in [place]? --     Constitutional: Alert and oriented. Well appearing and in no acute distress. Eyes: Conjunctivae are normal. Head: Atraumatic. Nose: No congestion/rhinnorhea. Mouth/Throat: Mucous membranes are moist.  Oropharynx non-erythematous. Neck: No stridor.   Cardiovascular: Normal rate, regular rhythm. Grossly normal heart sounds.  Good peripheral circulation. Respiratory: Normal respiratory effort.  No retractions. Lungs CTAB. Gastrointestinal: Soft and nontender. No distention. No abdominal bruits. No CVA  tenderness. Musculoskeletal:no edema right hip is tender. Neurologic:  Normal speech and language. No gross focal neurologic deficits are appreciated. Skin:  Skin is warm, dry and intact. No rash noted.   ____________________________________________   LABS (all labs ordered are listed, but only abnormal results are displayed)  Labs Reviewed  CBC WITH DIFFERENTIAL/PLATELET - Abnormal; Notable for the following components:      Result Value   WBC 13.3 (*)    Neutro Abs 11.3 (*)    Lymphs Abs 0.8 (*)    Monocytes Absolute 1.0 (*)    All other components within normal limits  COMPREHENSIVE METABOLIC PANEL - Abnormal; Notable for the following components:   Sodium 134 (*)    Glucose, Bld 150 (*)    BUN 25 (*)    Creatinine, Ser 1.58 (*)    Total Bilirubin  1.5 (*)    GFR calc non Af Amer 32 (*)    GFR calc Af Amer 37 (*)    All other components within normal limits   ____________________________________________  EKG   ____________________________________________  RADIOLOGY   ____________________________________________   PROCEDURES  Procedure(s) performed:   Procedures  Critical Care performed:   ____________________________________________   INITIAL IMPRESSION / ASSESSMENT AND PLAN / ED COURSE  orthopedics is scrubbed in surgery and will not be done for about 2 hours. Dr. Cherylann Banas will see if we can sedate the patient and reviewed locate the hip.      ____________________________________________   FINAL CLINICAL IMPRESSION(S) / ED DIAGNOSES  Final diagnoses:  Dislocation of hip prosthesis, initial encounter [facility])     ED Discharge Orders    None       Note:  This document was prepared using Dragon voice recognition software and may include unintentional dictation errors.    Nena Polio, MD 05/31/17 [phone]

## 2017-05-31 NOTE — Sedation Documentation (Signed)
Unsuccessful attempt to relocate hip. Pt alert to voice and pain. Vitals WDL

## 2017-05-31 NOTE — Transfer of Care (Signed)
Immediate Anesthesia Transfer of Care Note  Patient: Nancy Perkins  Procedure(s) Performed: CLOSED REDUCTION HIP (Right Hip)  Patient Location: PACU  Anesthesia Type:General  Level of Consciousness: awake, alert , oriented and patient cooperative  Airway & Oxygen Therapy: Patient Spontanous Breathing and Patient connected to nasal cannula oxygen  Post-op Assessment: Report given to RN and Post -op Vital signs reviewed and stable  Post vital signs: Reviewed and stable  Last Vitals:  Vitals:   05/31/17 1807 05/31/17 2055  BP: (!) 146/79   Pulse: 87   Resp: 15   Temp:  (P) 36.7 C  SpO2: 96% (P) 95%    Last Pain:  Vitals:   05/31/17 1911  TempSrc:   PainSc: 7          Complications: No apparent anesthesia complications

## 2017-05-31 NOTE — Sedation Documentation (Signed)
MD Siadecki attempting to re locate right hip

## 2017-05-31 NOTE — Sedation Documentation (Signed)
Pt following commands.

## 2017-05-31 NOTE — Anesthesia Preprocedure Evaluation (Signed)
Anesthesia Evaluation  Patient identified by MRN, date of birth, ID band Patient awake    Reviewed: Allergy & Precautions, H&P , NPO status , Patient's Chart, lab work & pertinent test results  History of Anesthesia Complications (+) PROLONGED EMERGENCE and history of anesthetic complications  Airway Mallampati: III  TM Distance: <3 FB Neck ROM: limited    Dental  (+) Chipped, Edentulous Upper, Edentulous Lower   Pulmonary neg shortness of breath, Current Smoker,           Cardiovascular Exercise Tolerance: Poor hypertension, (-) angina(-) Past MI and (-) DOE      Neuro/Psych negative neurological ROS  negative psych ROS   GI/Hepatic Neg liver ROS, GERD  Medicated and Controlled,  Endo/Other  negative endocrine ROSdiabetes, Type 2  Renal/GU Renal disease     Musculoskeletal   Abdominal   Peds  Hematology negative hematology ROS (+)   Anesthesia Other Findings Past Medical History: No date: Acid reflux No date: Cancer [facility])     Comment:  skin ca 12/09/2012: Chronic kidney disease (CKD), stage III (moderate) ([facility]) 10/09/2010: Combined fat and carbohydrate induced hyperlipemia 10/09/2010: Current tobacco use 10/09/2010: Essential (primary) hypertension 10/10/2012: Hernia, rectovaginal 10/10/2012: Mixed incontinence 02/05/2015: Personal history of tobacco use, presenting hazards to  health  Past Surgical History: No date: ABDOMINAL HYSTERECTOMY No date: HIP SURGERY No date: REPLACEMENT TOTAL KNEE BILATERAL  BMI    Body Mass Index:  36.96 kg/m      Reproductive/Obstetrics negative OB ROS                             Anesthesia Physical Anesthesia Plan  ASA: III  Anesthesia Plan: General ETT   Post-op Pain Management:    Induction: Intravenous  PONV Risk Score and Plan: 3 and Ondansetron and Dexamethasone  Airway Management Planned: Oral ETT  Additional Equipment:   Intra-op  Plan:   Post-operative Plan: Extubation in OR  Informed Consent: I have reviewed the patients History and Physical, chart, labs and discussed the procedure including the risks, benefits and alternatives for the proposed anesthesia with the patient or authorized representative who has indicated his/her understanding and acceptance.   Dental Advisory Given  Plan Discussed with: Anesthesiologist, CRNA and Surgeon  Anesthesia Plan Comments: (Patient consented for risks of anesthesia including but not limited to:  - adverse reactions to medications - damage to teeth, lips or other oral mucosa - sore throat or hoarseness - Damage to heart, brain, lungs or loss of life  Patient voiced understanding.)        Anesthesia Quick Evaluation

## 2017-05-31 NOTE — H&P (Signed)
HISTORY & PHYSICAL  Subjective:  Chief complaint:  R hip pain  The patient is a 72 y.o. female who sustained an injury to the R hip. She had a fall today resulting in immediate hip pain and leg deformity with inability to move hip. Pain is severe and located in groin region. It is described as sharp and throbbing. It is improved with rest, worse with any movement. X-rays in ED showed R hip periprosthetic dislocation. She had her surgery done at [facility] in 1999 and then was revised due to loosening in 2012. ED staff attempted to reduce, but could not get sedated enough without her dropping O2 sat levels.   Patient Active Problem List   Diagnosis Date Noted  . Personal history of tobacco use, presenting hazards to health 02/05/2015  . Chronic kidney disease (CKD), stage III (moderate) ([place]) 12/09/2012  . Mixed incontinence 10/10/2012  . Hernia, rectovaginal 10/10/2012  . Essential (primary) hypertension 10/09/2010  . 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 Current tobacco use 10/09/2010   Past Medical History:  Diagnosis Date  . Acid reflux   . Cancer ([place])    skin ca  . Chronic kidney disease (CKD), stage III (moderate) ([place]) 12/09/2012  . 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 Current tobacco use 10/09/2010  . Essential (primary) hypertension 10/09/2010  . Hernia, rectovaginal 10/10/2012  . Mixed incontinence 10/10/2012  . Personal history of tobacco use, presenting hazards to health 02/05/2015    Past Surgical History:  Procedure Laterality Date  . ABDOMINAL HYSTERECTOMY    . HIP SURGERY    . REPLACEMENT TOTAL KNEE BILATERAL       (Not in a hospital admission) Allergies  Allergen Reactions  . Beta Adrenergic Blockers Other (See Comments)    Heart block  . Hydrochlorothiazide Other (See Comments)    Caused a heartblock  . Sulfathiazole     Other reaction(s): RASH  . Thiazide-Type Diuretics     Heart block caused by beta  blockers in general    Social History   Tobacco Use  . Smoking status: Current Every Day Smoker    Packs/day: 1.00    Years: 31.00    Pack years: 31.00    Types: Cigarettes  Substance Use Topics  . Alcohol use: Not on file    Family History  Problem Relation Age of Onset  . Breast cancer Sister 8  . Breast cancer Maternal Aunt   . Breast cancer Cousin 24       paternal  . Prostate cancer Maternal Grandfather   . Bladder Cancer Neg Hx   . Kidney cancer Neg Hx      Review of Systems: As noted above. The patient denies any chest pain, shortness of breath, nausea, vomiting, diarrhea, constipation, belly pain, blood in his/her stool, or burning with urination.  Objective: Temp:  [97.8 F (36.6 C)] 97.8 F (36.6 C) (01/21 1539) Pulse Rate:  [80-100] 87 (01/21 1807) Resp:  [15-31] 15 (01/21 1807) BP: (84-152)/(50-114) 146/79 (01/21 1807) SpO2:  [90 %-100 %] 96 % (01/21 1807) Weight:  [103.9 kg (229 lb)] 103.9 kg (229 lb) (01/21 1230)  Physical Exam: General:  Alert, no acute distress Psychiatric:  Patient is not competent for consent (due to sedation in ED) with normal mood and affect  Cardiovascular:  RRR  Respiratory:  Chest sounds clearr. No wheezing. Non-labored breathing GI:  Abdomen is soft and non-tender Skin:  No lesions in the area of chief complaint  Neurologic:  Sensation intact distally Lymphatic:  No axillary or cervical lymphadenopathy  Orthopedic Exam:  RLE: - Foot shortened and internally rotated - 5/5 DF/PF/EHL - SILT s/s/t/sp/dp distr - 2+ dp pulse - significant venous changes over foot   Imaging Review: Recent x-rays of the ED are available for review.  These films demonstrate R hip periprosthetic dislocation. There is no notable fracture.   Assessment: 72 yo F w/R hip periprosthetic dislocation.   Plan: The treatment options, including both surgical and nonsurgical choices, have been discussed in detail with the patient and his/her family.  The risks (including inability to reduce hip in a closed manner, need for further surgery, etc) and benefits of the surgical procedure were discussed. The patient and family states his/her understanding and agrees to proceed. Plan for R hip closed reduction.    Leim Fabry  05/31/2017 8:19 PM

## 2017-06-01 ENCOUNTER — Encounter: Payer: Self-pay | Admitting: Orthopedic Surgery

## 2017-06-01 MED ORDER — HYDROCODONE-ACETAMINOPHEN 5-325 MG PO TABS: 1.0000 | ORAL_TABLET | ORAL | 0 refills | Status: DC | PRN

## 2017-06-01 MED ORDER — ENOXAPARIN SODIUM 40 MG/0.4ML ~~LOC~~ SOLN: 40.0000 mg | SUBCUTANEOUS | 0 refills | Status: DC

## 2017-06-01 MED ORDER — MAGNESIUM OXIDE 400 (241.3 MG) MG PO TABS
200.0000 mg | ORAL_TABLET | Freq: Every day | ORAL | Status: DC
  Filled 2017-06-01: qty 1

## 2017-06-01 NOTE — Discharge Instructions (Signed)
INSTRUCTIONS AFTER Surgery  o Remove items at home which could result in a fall. This includes throw rugs or furniture in walking pathways o ICE to the affected joint every three hours while awake for 30 minutes at a time, for at least the first 3-5 days, and then as needed for pain and swelling.  Continue to use ice for pain and swelling. You may notice swelling that will progress down to the foot and ankle.  This is normal after surgery.  Elevate your leg when you are not up walking on it.   o Continue to use the breathing machine you got in the hospital (incentive spirometer) which will help keep your temperature down.  It is common for your temperature to cycle up and down following surgery, especially at night when you are not up moving around and exerting yourself.  The breathing machine keeps your lungs expanded and your temperature down.   DIET:  As you were doing prior to hospitalization, we recommend a well-balanced diet.  DRESSING / WOUND CARE / SHOWERING Knee immobilizer on at all times except for bathing and dressing. No bending at the waist or rotating the hip without the brace.   ACTIVITY Knee immobilizer needs to remain in place at all times. Able to take this off for bathing and dressing only. No bending at the waist when the brace is off. The brace will be worn for 4 weeks. o Increase activity slowly as tolerated, but follow the weight bearing instructions below.   o No driving for 6 weeks or until further direction given by your physician.  You cannot drive while taking narcotics.  o No lifting or carrying greater than 10 lbs. until further directed by your surgeon. o Avoid periods of inactivity such as sitting longer than an hour when not asleep. This helps prevent blood clots.  o You may return to work once you are authorized by your doctor.     WEIGHT BEARING   Weight bearing as tolerated with assist device (walker, cane, etc) as directed, use it as long as suggested by  your surgeon or therapist, typically at least 4-6 weeks.   EXERCISES  Results after joint surgery are often greatly improved when you follow the exercise, range of motion and muscle strengthening exercises prescribed by your doctor. Safety measures are also important to protect the joint from further injury. Any time any of these exercises cause you to have increased pain or swelling, decrease what you are doing until you are comfortable again and then slowly increase them. If you have problems or questions, call your caregiver or physical therapist for advice.   Rehabilitation is important following a joint surgery. After just a few days of immobilization, the muscles of the leg can become weakened and shrink (atrophy).  These exercises are designed to build up the tone and strength of the thigh and leg muscles and to improve motion. Often times heat used for twenty to thirty minutes before working out will loosen up your tissues and help with improving the range of motion but do not use heat for the first two weeks following surgery (sometimes heat can increase post-operative swelling).   These exercises can be done on a training (exercise) mat, on the floor, on a table or on a bed. Use whatever works the best and is most comfortable for you.    Use music or television while you are exercising so that the exercises are a pleasant break in your day. This will  make your life better with the exercises acting as a break in your routine that you can look forward to.   Perform all exercises about fifteen times, three times per day or as directed.  You should exercise both the operative leg and the other leg as well.  Exercises include:    Quad Sets - Tighten up the muscle on the front of the thigh (Quad) and hold for 5-10 seconds.    Straight Leg Raises - With your knee straight (if you were given a brace, keep it on), lift the leg to 60 degrees, hold for 3 seconds, and slowly lower the leg.  Perform  this exercise against resistance later as your leg gets stronger.   Leg Slides: Lying on your back, slowly slide your foot toward your buttocks, bending your knee up off the floor (only go as far as is comfortable). Then slowly slide your foot back down until your leg is flat on the floor again.   Angel Wings: Lying on your back spread your legs to the side as far apart as you can without causing discomfort.   Hamstring Strength:  Lying on your back, push your heel against the floor with your leg straight by tightening up the muscles of your buttocks.  Repeat, but this time bend your knee to a comfortable angle, and push your heel against the floor.  You may put a pillow under the heel to make it more comfortable if necessary.   A rehabilitation program following joint surgery can speed recovery and prevent re-injury in the future due to weakened muscles. Contact your doctor or a physical therapist for more information on knee rehabilitation.    CONSTIPATION  Constipation is defined medically as fewer than three stools per week and severe constipation as less than one stool per week.  Even if you have a regular bowel pattern at home, your normal regimen is likely to be disrupted due to multiple reasons following surgery.  Combination of anesthesia, postoperative narcotics, change in appetite and fluid intake all can affect your bowels.   YOU MUST use at least one of the following options; they are listed in order of increasing strength to get the job done.  They are all available over the counter, and you may need to use some, POSSIBLY even all of these options:    Drink plenty of fluids (prune juice may be helpful) and high fiber foods Colace 100 mg by mouth twice a day  Senokot for constipation as directed and as needed Dulcolax (bisacodyl), take with full glass of water  Miralax (polyethylene glycol) once or twice a day as needed.  If you have tried all these things and are unable to have a  bowel movement in the first 3-4 days after surgery call either your surgeon or your primary doctor.    If you experience loose stools or diarrhea, hold the medications until you stool forms back up.  If your symptoms do not get better within 1 week or if they get worse, check with your doctor.  If you experience "the worst abdominal pain ever" or develop nausea or vomiting, please contact the office immediately for further recommendations for treatment.   ITCHING:  If you experience itching with your medications, try taking only a single pain pill, or even half a pain pill at a time.  You can also use Benadryl over the counter for itching or also to help with sleep.   TED HOSE STOCKINGS:  Use stockings on  both legs until for at least 2 weeks or as directed by physician office. They may be removed at night for sleeping.  MEDICATIONS:  See your medication summary on the After Visit Summary that nursing will review with you.  You may have some home medications which will be placed on hold until you complete the course of blood thinner medication.  It is important for you to complete the blood thinner medication as prescribed.  PRECAUTIONS:  If you experience chest pain or shortness of breath - call 911 immediately for transfer to the hospital emergency department.   If you develop a fever greater that 101 F, purulent drainage from wound, increased redness or drainage from wound, foul odor from the wound/dressing, or calf pain - CONTACT YOUR SURGEON.                                                   FOLLOW-UP APPOINTMENTS:  If you do not already have a post-op appointment, please call the office for an appointment to be seen by your surgeon.  Guidelines for how soon to be seen are listed in your After Visit Summary, but are typically between 1-4 weeks after surgery.  OTHER INSTRUCTIONS:    MAKE SURE YOU:   Understand these instructions.   Get help right away if you are not doing well or get  worse.    Thank you for letting us be a part of your medical care team.  It is a privilege we respect greatly.  We hope these instructions will help you stay on track for a fast and full recovery!

## 2017-06-01 NOTE — Discharge Summary (Signed)
Physician Discharge Summary  Subjective: 1 Day Post-Op Procedure(s) (LRB): CLOSED REDUCTION HIP (Right) Patient reports pain as mild.   Patient seen in rounds with Dr. Posey Pronto. Patient is well, and has had no acute complaints or problems Patient is ready to go home  Physician Discharge Summary  Patient ID: Nancy Perkins MRN: [id number] DOB/AGE: Jan 16, 1946 72 y.o.  Admit date: 05/31/2017 Discharge date: 06/01/2017  Admission Diagnoses:  Discharge Diagnoses:  Active Problems:   History of closed hip dislocation   Discharged Condition: good  Hospital Course: The patient is postop day 1 from a right total hip dislocation with reduction. The patient is doing much better since surgery. She is in a knee immobilizer and resting well.  Treatments: surgery:  1. Closed reduction of R hip  SURGEON: Cato Mulligan, MD   ANESTHESIA:Gen  ESTIMATED BLOOD LOSS:none  TOTAL IV FLUIDS:none    Discharge Exam: Blood pressure 103/62, pulse 77, temperature 98.7 F (37.1 C), temperature source Oral, resp. rate 16, height 5\' 6"  (1.676 m), weight 103.9 kg (229 lb), SpO2 91 %.   Disposition: Final discharge disposition not confirmed   Allergies as of 06/01/2017      Reactions   Beta Adrenergic Blockers Other (See Comments)   Heart block   Hydrochlorothiazide Other (See Comments)   Caused a heartblock   Sulfathiazole    Other reaction(s): RASH   Thiazide-type Diuretics    Heart block caused by beta blockers in general      Medication List    TAKE these medications   aspirin 81 MG tablet Take 81 mg by mouth.   buPROPion 300 MG 24 hr tablet Commonly known as:  WELLBUTRIN XL Take 1 tablet by mouth daily.   CELEXA 40 MG tablet Generic drug:  citalopram Take 40 mg by mouth daily.   cephALEXin 250 MG capsule Commonly known as:  KEFLEX Take 1 capsule by mouth daily.   cyclobenzaprine 5 MG tablet Commonly known as:  FLEXERIL Take 1 tablet by mouth 3 (three) times  daily.   DULoxetine 60 MG capsule Commonly known as:  CYMBALTA Take 1 capsule by mouth daily.   enoxaparin 40 MG/0.4ML injection Commonly known as:  LOVENOX Inject 0.4 mLs (40 mg total) into the skin daily.   HYDROcodone-acetaminophen 5-325 MG tablet Commonly known as:  NORCO/VICODIN Take 1 tablet by mouth every 4 (four) hours as needed.   KLOR-CON 10 10 MEQ tablet Generic drug:  potassium chloride Take 10 mEq by mouth daily.   Magnesium 250 MG Tabs Take 250 mg by mouth daily.   methenamine 1 g tablet Commonly known as:  HIPREX Take 1 tablet (1 g total) by mouth 2 (two) times daily with a meal.   nystatin cream Commonly known as:  MYCOSTATIN continuous as needed.   omeprazole 40 MG capsule Commonly known as:  PRILOSEC Take 40 mg by mouth daily.   ondansetron 4 MG disintegrating tablet Commonly known as:  ZOFRAN-ODT Take 1 tablet by mouth daily.   simvastatin 20 MG tablet Commonly known as:  ZOCOR Take 20 mg by mouth at bedtime.   STOOL SOFTENER 100 MG capsule Generic drug:  docusate sodium Take 100 mg by mouth.   Vitamin D (Ergocalciferol) 50000 units Caps capsule Commonly known as:  DRISDOL Take 50,000 Units by mouth every 7 (seven) days.        SignedPrescott Parma, Oliana Gowens 06/01/2017, 6:56 AM   Objective: Vital signs in last 24 hours: Temp:  [97.8 F (36.6 C)-99.1 F (37.3  C)] 98.7 F (37.1 C) (01/22 0402) Pulse Rate:  [77-100] 77 (01/22 0402) Resp:  [15-31] 16 (01/21 2210) BP: (84-152)/(39-114) 103/62 (01/22 0402) SpO2:  [90 %-100 %] 91 % (01/22 0402) Weight:  [103.9 kg (229 lb)] 103.9 kg (229 lb) (01/21 1230)  Intake/Output from previous day:  Intake/Output Summary (Last 24 hours) at 06/01/2017 0656 Last data filed at 05/31/2017 2043 Gross per 24 hour  Intake 400 ml  Output -  Net 400 ml    Intake/Output this shift: Total I/O In: 400 [I.V.:400] Out: -   Labs: Recent Labs    05/31/17 1234  HGB 14.5   Recent Labs    05/31/17 1234  WBC  13.3*  RBC 4.78  HCT 44.3  PLT 194   Recent Labs    05/31/17 1234  NA 134*  K 4.5  CL 103  CO2 22  BUN 25*  CREATININE 1.58*  GLUCOSE 150*  CALCIUM 9.4   No results for input(s): LABPT, INR in the last 72 hours.  EXAM: General - Patient is Alert and Oriented Extremity - Neurovascular intact Dorsiflexion/Plantar flexion intact Incision - no wound. The immobilizer in place. Motor Function -  ankle dorsiflexion and plantarflexion are intact.   Assessment/Plan: 1 Day Post-Op Procedure(s) (LRB): CLOSED REDUCTION HIP (Right) Procedure(s) (LRB): CLOSED REDUCTION HIP (Right) Past Medical History:  Diagnosis Date  . Acid reflux   . Cancer ([place])    skin ca  . Chronic kidney disease (CKD), stage III (moderate) ([place]) 12/09/2012  . 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 Current tobacco use 10/09/2010  . Essential (primary) hypertension 10/09/2010  . Hernia, rectovaginal 10/10/2012  . Mixed incontinence 10/10/2012  . Personal history of tobacco use, presenting hazards to health 02/05/2015   Active Problems:   History of closed hip dislocation  Estimated body mass index is 36.96 kg/m as calculated from the following:   Height as of this encounter: 5\' 6"  (1.676 m).   Weight as of this encounter: 103.9 kg (229 lb). Advance diet Up with therapy  Discharge home Diet - Regular diet Follow up - in 2 weeks Activity - WBAT Disposition - Home Condition Upon Discharge - Good DVT Prophylaxis - Lovenox  Reche Dixon, PA-C Orthopaedic Surgery 06/01/2017, 6:56 AM

## 2017-06-01 NOTE — Progress Notes (Signed)
OT Cancellation Note  Patient Details Name: Marquelle Balow MRN: [id number] DOB: 1946/02/06   Cancelled Treatment:    Reason Eval/Treat Not Completed: Other (comment). Order received, chart reviewed. Upon attempt, pt already discharged home.   Jeni Salles, MPH, MS, OTR/L ascom [phone] 06/01/17, 3:04 PM

## 2017-06-01 NOTE — Care Management Note (Signed)
Case Management Note  Patient Details  Name: Bertice Risse MRN: [id number] Date of Birth: [birth year]  Subjective/Objective:  POD # 1 right hip dislocation with closed reduction. Met with patient and her daughter at bedside. She has taken the week off to stay with her mom. The daughter lives with her mom. Patient has a cane, walker and bsc. She wants to do outpatient physical therapy at [facility] in [place]. Daughter plans to arrange this. PCP is Hollice Gong. No needs identified.                 Action/Plan: No needs identified.   Expected Discharge Date:  06/01/17               Expected Discharge Plan:  OP Rehab  In-House Referral:     Discharge planning Services  CM Consult  Post Acute Care Choice:    Choice offered to:  Patient, Adult Children  DME Arranged:    DME Agency:     HH Arranged:    [place] Agency:     Status of Service:  Completed, signed off  If discussed at [employer] of Stay Meetings, dates discussed:    Additional Comments:  Jolly Mango, RN 06/01/2017, 8:56 AM

## 2017-06-01 NOTE — Care Management Obs Status (Signed)
[place] NOTIFICATION   Patient Details  Name: Nancy Perkins MRN: [id number] Date of Birth: 05/19/1945   Medicare Observation Status Notification Given:  Yes    Jolly Mango, RN 06/01/2017, 8:55 AM

## 2017-06-01 NOTE — Progress Notes (Signed)
Physical Therapy Treatment Patient Details Name: Nancy Perkins MRN: [id number] DOB: 06/01/1945 Today's Date: 06/01/2017    History of Present Illness Pt admitted for R hip dislocation with subsequent reduction under anesthesia on 05/31/17. 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 Other significant history includes skin cancer, CKD, HTN, B TKR, and B THA. Pt reports she fell while going to bathroom    PT Comments    Pt is making gradual progress towards goals and demonstrates ability to safely enter home with stair training. Recommend 24/7 assist secondary to safety concerns as pt fatigues quickly. Daughter able to provide and in room for therapy sessions. Gave written HEP for continued use at home. Able to perform functional mobility easier in PM compared to AM session. Plans to dc home this date.   Follow Up Recommendations  Supervision/Assistance - 24 hour;Home health PT     Equipment Recommendations  None recommended by PT    Recommendations for Other Services       Precautions / Restrictions Precautions Precautions: Fall;Posterior Hip Precaution Booklet Issued: Yes (comment) Required Braces or Orthoses: Knee Immobilizer - Right Knee Immobilizer - Right: On at all times Restrictions Weight Bearing Restrictions: Yes RLE Weight Bearing: Weight bearing as tolerated    Mobility  Bed Mobility Overal bed mobility: Needs Assistance Bed Mobility: Supine to Sit     Supine to sit: Min assist     General bed mobility comments: improved tolerance and ability to follow commands. Once seated at EOB, heavy cues given for not leaning forward due to precautions.  Transfers Overall transfer level: Needs assistance Equipment used: Rolling walker (2 wheeled) Transfers: Sit to/from Stand Sit to Stand: Mod assist         General transfer comment: Cues for hand placement prior to standing. Upright posture noted with RW.  Ambulation/Gait Ambulation/Gait  assistance: Min guard Ambulation Distance (Feet): 50 Feet Assistive device: Rolling walker (2 wheeled) Gait Pattern/deviations: Step-to pattern     General Gait Details: slow step to gait pattern and fatigues quickly. Chair follow for safety.   Stairs Stairs: Yes   Stair Management: One rail Left Number of Stairs: 4 General stair comments: Pt navigated 4 stairs with L rail. Step to gait pattern performed with pt highly anxious. Lots of verbal cues given for relaxation. Towards end of stair training, pt fatigued and wanted to sit down. +2 assist provided to assist pt to recliner safely. Daughter in room and educated on safety during stair training.  Wheelchair Mobility    Modified Rankin (Stroke Patients Only)       Balance                                            Cognition Arousal/Alertness: Awake/alert Behavior During Therapy: WFL for tasks assessed/performed Overall Cognitive Status: Within Functional Limits for tasks assessed                                        Exercises Other Exercises Other Exercises: supine ther-ex performed on R LE including ankle pumps, glut squeezes, and hip abd/add. L LE ther-ex including SAQ. All ther-ex performed x 10 reps with min/mod assist and heavy cues for encouragement.    General Comments        Pertinent Vitals/Pain  Pain Assessment: 0-10 Pain Score: 9  Pain Location: R hip Pain Descriptors / Indicators: Operative site guarding Pain Intervention(s): Limited activity within patient's tolerance;Premedicated before session;Repositioned    Home Living                      Prior Function            PT Goals (current goals can now be found in the care plan section) Acute Rehab PT Goals Patient Stated Goal: to go home PT Goal Formulation: With patient Time For Goal Achievement: 06/15/17 Potential to Achieve Goals: Good Additional Goals Additional Goal #1: Pt will be able to  perform bed mobility/transfers with cga and safe technique in order to improve functional independence Progress towards PT goals: Progressing toward goals    Frequency    BID      PT Plan Current plan remains appropriate    Co-evaluation              AM-PAC PT "6 Clicks" Daily Activity  Outcome Measure  Difficulty turning over in bed (including adjusting bedclothes, sheets and blankets)?: Unable Difficulty moving from lying on back to sitting on the side of the bed? : Unable Difficulty sitting down on and standing up from a chair with arms (e.g., wheelchair, bedside commode, etc,.)?: Unable Help needed moving to and from a bed to chair (including a wheelchair)?: A Lot Help needed walking in hospital room?: A Lot Help needed climbing 3-5 steps with a railing? : A Lot 6 Click Score: 9    End of Session Equipment Utilized During Treatment: Gait belt Activity Tolerance: Patient limited by pain Patient left: in chair;with family/visitor present Nurse Communication: Mobility status PT Visit Diagnosis: Unsteadiness on feet (R26.81);Muscle weakness (generalized) (M62.81);History of falling (Z91.81);Difficulty in walking, not elsewhere classified (R26.2);Pain Pain - Right/Left: Right Pain - part of body: Hip     Time: 9629-5284 PT Time Calculation (min) (ACUTE ONLY): 38 min  Charges:  $Gait Training: 23-37 mins $Therapeutic Exercise: 8-22 mins                    G Codes:       Greggory Stallion, PT, DPT [phone]    Floyed Masoud 06/01/2017, 4:00 PM

## 2017-06-01 NOTE — Care Management Note (Signed)
Case Management Note  Patient Details  Name: Nancy Perkins MRN: [id number] Date of Birth: [birth year]  Subjective/Objective:    PT now recommending home health PT. Daughter and patient in agreement. Spoke with both a bedside.No agency preference for home health. Referral to [facility] with [facility] for HHPT.                  Action/Plan: [facility] for HHPT.   Expected Discharge Date:  06/01/17               Expected Discharge Plan:  [place]  In-House Referral:     Discharge planning Services  CM Consult  Post Acute Care Choice:    Choice offered to:  Patient, Adult Children  DME Arranged:    DME Agency:     HH Arranged:  PT [place]:  [place]  Status of Service:  Completed, signed off  If discussed at [place] of Stay Meetings, dates discussed:    Additional Comments:  Jolly Mango, RN 06/01/2017, 2:07 PM

## 2017-06-01 NOTE — Progress Notes (Signed)
DISCHARGE NOTE:  Pt given discharge instructions and prescriptions. Pt verbalized understanding. Pt wheeled to car by staff.  

## 2017-06-01 NOTE — Evaluation (Signed)
Physical Therapy Evaluation Patient Details Name: Nancy Perkins MRN: [id number] DOB: Oct 04, 1945 Today's Date: 06/01/2017   History of Present Illness  Pt admitted for R hip dislocation with subsequent reduction under anesthesia on 05/31/17. 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 Other significant history includes skin cancer, CKD, HTN, B TKR, and B THA. Pt reports she fell while going to bathroom  Clinical Impression  Pt is a pleasant 72 year old  who was admitted for R hip dislocation with reduction performed. Pt struggles with all mobility. Pt performs bed mobility/transfers with mod assist and ambulation with cga and RW. Pt needs constant reminders for hip precautions and reports high levels of pain.  Pt demonstrates deficits with strength/pain/mobility. Pt unable to perform stair training this session due to pain/fatigue. Will plan to perform stair training in PM. Would benefit from skilled PT to address above deficits and promote optimal return to PLOF. Recommend transition to [place] upon discharge from acute hospitalization.       Follow Up Recommendations Supervision/Assistance - 24 hour;Home health PT    Equipment Recommendations  None recommended by PT    Recommendations for Other Services       Precautions / Restrictions Precautions Precautions: Fall;Posterior Hip Precaution Booklet Issued: No Required Braces or Orthoses: Knee Immobilizer - Right Knee Immobilizer - Right: On at all times Restrictions Weight Bearing Restrictions: Yes RLE Weight Bearing: Weight bearing as tolerated      Mobility  Bed Mobility Overal bed mobility: Needs Assistance Bed Mobility: Supine to Sit     Supine to sit: Mod assist     General bed mobility comments: needs assist for sequencing and sliding out B LEs. Heavy cues for bed rail and placement of B UE. Once seated at EOB, needs reminders of precautions  Transfers Overall transfer level: Needs  assistance Equipment used: Rolling walker (2 wheeled) Transfers: Sit to/from Stand Sit to Stand: Mod assist         General transfer comment: Pt requires heavy encouragement for sequencing and placement of B UE as she wants to pull up on RW. Once standing, able to stand with upright posture.  Ambulation/Gait Ambulation/Gait assistance: Min guard Ambulation Distance (Feet): 40 Feet Assistive device: Rolling walker (2 wheeled) Gait Pattern/deviations: Step-to pattern     General Gait Details: slow step to gait pattern with RW. Cues for WBing and hip precautions. Pt fatigues quickly with increased distance, somewhat self limiting. Requesting to return back to bed.  Stairs            Wheelchair Mobility    Modified Rankin (Stroke Patients Only)       Balance Overall balance assessment: Needs assistance Sitting-balance support: Feet supported Sitting balance-Leahy Scale: Good     Standing balance support: Bilateral upper extremity supported Standing balance-Leahy Scale: Fair                               Pertinent Vitals/Pain Pain Assessment: 0-10 Pain Score: 7  Pain Location: R hip Pain Descriptors / Indicators: Operative site guarding Pain Intervention(s): Limited activity within patient's tolerance;Repositioned;RN gave pain meds during session    [place] expects to be discharged to:: Private residence Living Arrangements: Children Available Help at Discharge: Family;Available 24 hours/day Type of Home: House Home Access: Stairs to enter Entrance Stairs-Rails: Left Entrance Stairs-Number of Steps: 3 Home Layout: One level Home Equipment: Walker - 2 wheels;Bedside commode  Prior Function Level of Independence: Independent         Comments: fully independent     Hand Dominance        Extremity/Trunk Assessment   Upper Extremity Assessment Upper Extremity Assessment: Generalized weakness(B UE grossly 4/5)     Lower Extremity Assessment Lower Extremity Assessment: Generalized weakness(R LE grossly 3-/5; L LE grossly 4/5)       Communication   Communication: No difficulties  Cognition Arousal/Alertness: Awake/alert Behavior During Therapy: WFL for tasks assessed/performed Overall Cognitive Status: Within Functional Limits for tasks assessed                                        General Comments      Exercises Other Exercises Other Exercises: supine ther-ex performed on R LE including ankle pumps, glut squeezes, and hip abd/add. L LE ther-ex including SAQ. All ther-ex performed x 10 reps with min/mod assist and heavy cues for encouragement.   Assessment/Plan    PT Assessment Patient needs continued PT services  PT Problem List Decreased strength;Decreased activity tolerance;Decreased balance;Decreased mobility;Obesity;Pain       PT Treatment Interventions DME instruction;Gait training;Stair training;Therapeutic exercise    PT Goals (Current goals can be found in the Care Plan section)  Acute Rehab PT Goals Patient Stated Goal: to go home PT Goal Formulation: With patient Time For Goal Achievement: 06/15/17 Potential to Achieve Goals: Good    Frequency BID   Barriers to discharge        Co-evaluation               AM-PAC PT "6 Clicks" Daily Activity  Outcome Measure Difficulty turning over in bed (including adjusting bedclothes, sheets and blankets)?: Unable Difficulty moving from lying on back to sitting on the side of the bed? : Unable Difficulty sitting down on and standing up from a chair with arms (e.g., wheelchair, bedside commode, etc,.)?: Unable Help needed moving to and from a bed to chair (including a wheelchair)?: A Lot Help needed walking in hospital room?: A Lot Help needed climbing 3-5 steps with a railing? : A Lot 6 Click Score: 9    End of Session Equipment Utilized During Treatment: Gait belt Activity Tolerance: Patient  limited by pain Patient left: in bed;with bed alarm set;with SCD's reapplied;with family/visitor present Nurse Communication: Mobility status PT Visit Diagnosis: Unsteadiness on feet (R26.81);Muscle weakness (generalized) (M62.81);History of falling (Z91.81);Difficulty in walking, not elsewhere classified (R26.2);Pain Pain - Right/Left: Right Pain - part of body: Hip    Time: 0102-7253 PT Time Calculation (min) (ACUTE ONLY): 34 min   Charges:   PT Evaluation $PT Eval Low Complexity: 1 Low PT Treatments $Therapeutic Exercise: 8-22 mins   PT G CodesGreggory Stallion, PT, DPT [phone]   Sonoma Firkus 06/01/2017, 12:03 PM

## 2017-06-01 NOTE — Op Note (Signed)
Operative Note   SURGERY DATE: 06/01/2017   PRE-OP DIAGNOSIS:  1. R prosthetic hip dislocation   POST-OP DIAGNOSIS:  1. R prosthetic hip dislocation  PROCEDURE(S): 1. Closed reduction of R hip  SURGEON: Cato Mulligan, MD   ANESTHESIA: Gen  ESTIMATED BLOOD LOSS: none  TOTAL IV FLUIDS: none  INDICATION(S): The patient is an 72 yo F with R prosthetic hip dislocation sustained after a fall. She had primary R THA in 1999 and revision for loosening in 2012. She has not had any prior dislocation episodes. Closed reduction of hip was unsuccessful by ED staff.  After discussion of risks, benefits, and alternatives to the procedure, the patient elected to proceed.   OPERATIVE FINDINGS: R hip dislocation  OPERATIVE REPORT:   The patient was seen in the Holding Room. The patient concurred with the proposed plan, giving informed consent. The site of surgery was properly noted/marked. The patient was taken to Operating Room. A Time Out was held and the patient identity, procedure, and laterality was confirmed. After administration of adequate anesthesia, reduction maneuver consisting of hip flexion, adduction, and rotation was performed. A palpable clunk was felt as the hip reduced. X-rays were obtained to confirm reduction. A knee immobilizer was placed. The leg was kept in an abducted position with pillows in between the legs. The patient was awakened from anesthesia without any further complication and transferred to PACU for further recovery.    POST-OPERATIVE PLAN:  Patient will have posterior hip precautions. PT/OT in AM. The patient can be WBAT with knee immobilizer. She will plan to follow up with her original surgeon after discharge from the hospital.

## 2017-06-01 NOTE — Progress Notes (Signed)
  Subjective: 1 Day Post-Op Procedure(s) (LRB): CLOSED REDUCTION HIP (Right) Patient reports pain as mild.   Patient seen in rounds with Dr. Posey Pronto. Patient is well, and has had no acute complaints or problems Plan is to go Home after hospital stay. Negative for chest pain and shortness of breath Fever: no Gastrointestinal: Negative for nausea and vomiting  Objective: Vital signs in last 24 hours: Temp:  [97.8 F (36.6 C)-99.1 F (37.3 C)] 98.7 F (37.1 C) (01/22 0402) Pulse Rate:  [77-100] 77 (01/22 0402) Resp:  [15-31] 16 (01/21 2210) BP: (84-152)/(39-114) 103/62 (01/22 0402) SpO2:  [90 %-100 %] 91 % (01/22 0402) Weight:  [103.9 kg (229 lb)] 103.9 kg (229 lb) (01/21 1230)  Intake/Output from previous day:  Intake/Output Summary (Last 24 hours) at 06/01/2017 0647 Last data filed at 05/31/2017 2043 Gross per 24 hour  Intake 400 ml  Output -  Net 400 ml    Intake/Output this shift: Total I/O In: 400 [I.V.:400] Out: -   Labs: Recent Labs    05/31/17 1234  HGB 14.5   Recent Labs    05/31/17 1234  WBC 13.3*  RBC 4.78  HCT 44.3  PLT 194   Recent Labs    05/31/17 1234  NA 134*  K 4.5  CL 103  CO2 22  BUN 25*  CREATININE 1.58*  GLUCOSE 150*  CALCIUM 9.4   No results for input(s): LABPT, INR in the last 72 hours.   EXAM General - Patient is Alert and Oriented Extremity - Sensation intact distally Dorsiflexion/Plantar flexion intact Compartment soft Dressing/Incision - knee immobilizer in place. No incision. Motor Function - intact, moving foot and toes well on exam.   Past Medical History:  Diagnosis Date  . Acid reflux   . Cancer ([place])    skin ca  . Chronic kidney disease (CKD), stage III (moderate) ([place]) 12/09/2012  . 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 Current tobacco use 10/09/2010  . Essential (primary) hypertension 10/09/2010  . Hernia, rectovaginal 10/10/2012  . Mixed incontinence 10/10/2012  . Personal history of  tobacco use, presenting hazards to health 02/05/2015    Assessment/Plan: 1 Day Post-Op Procedure(s) (LRB): CLOSED REDUCTION HIP (Right) Active Problems:   History of closed hip dislocation  Estimated body mass index is 36.96 kg/m as calculated from the following:   Height as of this encounter: 5\' 6"  (1.676 m).   Weight as of this encounter: 103.9 kg (229 lb). Advance diet Up with therapy  Discharge home after physical therapy.  DVT Prophylaxis - Lovenox and TED hose Weight-Bearing as tolerated to right leg  Reche Dixon, PA-C Orthopaedic Surgery 06/01/2017, 6:47 AM

## 2017-06-11 NOTE — ED Provider Notes (Signed)
-----------------------------------------   2:29 PM on 06/11/2017 -----------------------------------------  This is a note for services provided during the patient's ED visit on 05/31/2017.  I took over care for this patient from Dr. Cinda Quest at 3 PM.  Reduction of R hip dislocation Date/Time: 06/11/2017 2:29 PM Performed by: Arta Silence, MD Authorized by: Arta Silence, MD  Consent: Written consent obtained. Risks and benefits: risks, benefits and alternatives were discussed Consent given by: patient Patient understanding: patient states understanding of the procedure being performed Patient consent: the patient's understanding of the procedure matches consent given Procedure consent: procedure consent matches procedure scheduled Relevant documents: relevant documents present and verified Imaging studies: imaging studies available Required items: required blood products, implants, devices, and special equipment available Patient identity confirmed: verbally with patient and arm band Time out: Immediately prior to procedure a "time out" was called to verify the correct patient, procedure, equipment, support staff and site/side marked as required. Local anesthesia used: no  Anesthesia: Local anesthesia used: no  Sedation: Patient sedated: yes Sedation type: moderate (conscious) sedation Sedatives: fentanyl and midazolam Vitals: Vital signs were monitored during sedation.  Patient tolerance: Patient tolerated the procedure well with no immediate complications Comments: The patient was sedated with fentanyl and midazolam.  Reduction attempted via direct traction.  We were unable to successfully reduce the joint despite adequate sedation.   .Sedation Date/Time: 06/11/2017 2:32 PM Performed by: Arta Silence, MD Authorized by: Arta Silence, MD   Consent:    Consent obtained:  Written   Consent given by:  Patient   Risks discussed:  Prolonged hypoxia  resulting in organ damage, dysrhythmia, prolonged sedation necessitating reversal, inadequate sedation and respiratory compromise necessitating ventilatory assistance and intubation   Alternatives discussed:  Analgesia without sedation Universal protocol:    Immediately prior to procedure a time out was called: yes     Patient identity confirmation method:  Arm band and verbally with patient Indications:    Procedure performed:  Dislocation reduction   Intended level of sedation:  Moderate (conscious sedation) Pre-sedation assessment:    Time since last food or drink:  8 hours   ASA classification: class 2 - patient with mild systemic disease     Neck mobility: normal     Mallampati score:  III - soft palate, base of uvula visible   Pre-sedation assessments completed and reviewed: airway patency, cardiovascular function, mental status, pain level and respiratory function   Immediate pre-procedure details:    Verified: bag valve mask available, intubation equipment available, oxygen available and suction available   Procedure details (see MAR for exact dosages):    Preoxygenation:  Nasal cannula   Sedation:  Midazolam   Analgesia:  Fentanyl   Intra-procedure monitoring:  Continuous capnometry, blood pressure monitoring, cardiac monitor, continuous pulse oximetry and frequent LOC assessments   Intra-procedure events: none     Total Provider sedation time (minutes):  10 Post-procedure details:    Attendance: Constant attendance by certified staff until patient recovered     Recovery: Patient returned to pre-procedure baseline     Patient is stable for discharge or admission: yes     Patient tolerance:  Tolerated well, no immediate complications   After unsuccessful reduction attempt in the ED, I consulted Dr. Posey Pronto from orthopedics who agreed to evaluate the patient take the patient to the OR for reduction under anesthesia.  The patient remained stable during the remainder of her ED  course.   Arta Silence, MD 06/11/17 1435

## 2017-07-26 DIAGNOSIS — R296 Repeated falls: Secondary | ICD-10-CM | POA: Insufficient documentation

## 2017-07-27 ENCOUNTER — Other Ambulatory Visit: Payer: Self-pay | Admitting: Acute Care

## 2017-07-27 DIAGNOSIS — R296 Repeated falls: Secondary | ICD-10-CM

## 2017-07-27 DIAGNOSIS — R2689 Other abnormalities of gait and mobility: Secondary | ICD-10-CM

## 2017-07-29 ENCOUNTER — Ambulatory Visit: Payer: Medicare Other

## 2017-08-11 ENCOUNTER — Ambulatory Visit
Admission: RE | Admit: 2017-08-11 | Discharge: 2017-08-11 | Disposition: A | Discharge status: Home / Self Care | Payer: Medicare Other | Source: Ambulatory Visit | Attending: Acute Care | Admitting: Acute Care

## 2017-08-11 ENCOUNTER — Encounter (INDEPENDENT_AMBULATORY_CARE_PROVIDER_SITE_OTHER): Payer: Self-pay

## 2017-08-11 DIAGNOSIS — R296 Repeated falls: Secondary | ICD-10-CM | POA: Diagnosis present

## 2017-08-11 DIAGNOSIS — G9389 Other specified disorders of brain: Secondary | ICD-10-CM | POA: Diagnosis not present

## 2017-08-11 DIAGNOSIS — R2689 Other abnormalities of gait and mobility: Secondary | ICD-10-CM | POA: Diagnosis not present

## 2017-08-11 DIAGNOSIS — I6782 Cerebral ischemia: Secondary | ICD-10-CM | POA: Diagnosis not present

## 2017-08-31 DIAGNOSIS — F0390 Unspecified dementia without behavioral disturbance: Secondary | ICD-10-CM | POA: Insufficient documentation

## 2017-08-31 DIAGNOSIS — F0280 Dementia in other diseases classified elsewhere without behavioral disturbance: Secondary | ICD-10-CM

## 2017-08-31 DIAGNOSIS — G629 Polyneuropathy, unspecified: Secondary | ICD-10-CM | POA: Insufficient documentation

## 2017-08-31 DIAGNOSIS — G988 Other disorders of nervous system: Secondary | ICD-10-CM

## 2017-09-02 ENCOUNTER — Ambulatory Visit
Admission: EM | Admit: 2017-09-02 | Discharge: 2017-09-02 | Disposition: A | Discharge status: Home / Self Care | Payer: Medicare Other | Attending: Family Medicine | Admitting: Family Medicine

## 2017-09-02 ENCOUNTER — Other Ambulatory Visit: Payer: Self-pay

## 2017-09-02 DIAGNOSIS — N390 Urinary tract infection, site not specified: Secondary | ICD-10-CM | POA: Diagnosis not present

## 2017-09-02 LAB — URINALYSIS, COMPLETE (UACMP) WITH MICROSCOPIC
GLUCOSE, UA: NEGATIVE mg/dL
HGB URINE DIPSTICK: NEGATIVE
Ketones, ur: NEGATIVE mg/dL
Nitrite: POSITIVE — AB
PH: 5.5 (ref 5.0–8.0)
Protein, ur: NEGATIVE mg/dL
SPECIFIC GRAVITY, URINE: 1.02 (ref 1.005–1.030)
WBC, UA: >50 WBC/hpf (ref 0–5)

## 2017-09-02 MED ORDER — CIPROFLOXACIN HCL 500 MG PO TABS: 500.0000 mg | ORAL_TABLET | Freq: Two times a day (BID) | ORAL | 0 refills | Status: AC

## 2017-09-02 NOTE — Discharge Instructions (Addendum)
Take medication as prescribed. Rest. Drink plenty of fluids.   Follow up with your primary care physician this week as scheduled. Return to Urgent care for new or worsening concerns.

## 2017-09-02 NOTE — ED Triage Notes (Signed)
Pt states some dysuria and "My urine stinks."

## 2017-09-02 NOTE — ED Provider Notes (Signed)
[facility] ____________________________________________  Time seen: Approximately 11:30 AM  I have reviewed the triage vital signs and the nursing notes.   HISTORY  Chief Complaint Dysuria  HPI Nancy Perkins is a 72 y.o. female presenting with sister at bedside for evaluation of urinary frequency, urinary discomfort and patient with malodorous urine.  Patient with a history of intermittent UTIs, and reports suspected to secondary nighttime incontinence.  Denies any change of baseline nighttime incontinence.  Reports last UTI was in the fall 2018.  States that she does take daily prophylactic 250 mg Keflex daily.  Denies any recent changes in kidney function.  States does have some intermittent low back pain, but reports that is chronic for her.  Denies flank pain.  Denies fevers, abdominal pain, diarrhea, vomiting.  Denies recent sickness. Denies recent antibiotic use other than above. Reports sulfa allergic. Reports otherwise feels well.    Renee Rival, NP: PCP    Past Medical History:  Diagnosis Date  . Acid reflux   . Cancer ([place])    skin ca  . Chronic kidney disease (CKD), stage III (moderate) ([place]) 12/09/2012  . 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 Current tobacco use 10/09/2010  . Essential (primary) hypertension 10/09/2010  . Hernia, rectovaginal 10/10/2012  . Mixed incontinence 10/10/2012  . Personal history of tobacco use, presenting hazards to health 02/05/2015    Patient Active Problem List   Diagnosis Date Noted  . History of closed hip dislocation 05/31/2017  . Personal history of tobacco use, presenting hazards to health 02/05/2015  . Chronic kidney disease (CKD), stage III (moderate) ([place]) 12/09/2012  . Mixed incontinence 10/10/2012  . Hernia, rectovaginal 10/10/2012  . Essential (primary) hypertension 10/09/2010  . 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 Current tobacco use 10/09/2010     Past Surgical History:  Procedure Laterality Date  . ABDOMINAL HYSTERECTOMY    . HIP CLOSED REDUCTION Right 05/31/2017   Procedure: CLOSED REDUCTION HIP;  Surgeon: Leim Fabry, MD;  Location: [facility] ORS;  Service: Orthopedics;  Laterality: Right;  . HIP SURGERY    . REPLACEMENT TOTAL KNEE BILATERAL       No current facility-administered medications for this encounter.   Current Outpatient Medications:  .  aspirin 81 MG tablet, Take 81 mg by mouth., Disp: , Rfl:  .  buPROPion (WELLBUTRIN XL) 300 MG 24 hr tablet, Take 1 tablet by mouth daily., Disp: , Rfl:  .  cephALEXin (KEFLEX) 250 MG capsule, Take 1 capsule by mouth daily., Disp: , Rfl:  .  ciprofloxacin (CIPRO) 500 MG tablet, Take 1 tablet (500 mg total) by mouth every 12 (twelve) hours for 7 days., Disp: 14 tablet, Rfl: 0 .  citalopram (CELEXA) 40 MG tablet, Take 40 mg by mouth daily. , Disp: , Rfl:  .  cyclobenzaprine (FLEXERIL) 5 MG tablet, Take 1 tablet by mouth 3 (three) times daily., Disp: , Rfl:  .  docusate sodium (STOOL SOFTENER) 100 MG capsule, Take 100 mg by mouth., Disp: , Rfl:  .  DULoxetine (CYMBALTA) 60 MG capsule, Take 1 capsule by mouth daily., Disp: , Rfl:  .  enoxaparin (LOVENOX) 40 MG/0.4ML injection, Inject 0.4 mLs (40 mg total) into the skin daily., Disp: 14 Syringe, Rfl: 0 .  HYDROcodone-acetaminophen (NORCO/VICODIN) 5-325 MG tablet, Take 1 tablet by mouth every 4 (four) hours as needed., Disp: 30 tablet, Rfl: 0 .  Magnesium 250 MG TABS, Take 250 mg by mouth daily. , Disp: ,  Rfl:  .  nystatin cream (MYCOSTATIN), continuous as needed., Disp: , Rfl:  .  omeprazole (PRILOSEC) 40 MG capsule, Take 40 mg by mouth daily. , Disp: , Rfl:  .  potassium chloride (KLOR-CON 10) 10 MEQ tablet, Take 10 mEq by mouth daily. , Disp: , Rfl:  .  simvastatin (ZOCOR) 20 MG tablet, Take 20 mg by mouth at bedtime. , Disp: , Rfl:  .  Vitamin D, Ergocalciferol, (DRISDOL) 50000 units CAPS capsule, Take 50,000 Units by mouth every 7  (seven) days. , Disp: , Rfl:   Allergies Beta adrenergic blockers; Hydrochlorothiazide; Sulfathiazole; and Thiazide-type diuretics  Family History  Problem Relation Age of Onset  . Breast cancer Sister 85  . Breast cancer Maternal Aunt   . Breast cancer Cousin 32       paternal  . Prostate cancer Maternal Grandfather   . Bladder Cancer Neg Hx   . Kidney cancer Neg Hx     Social History Social History   Tobacco Use  . Smoking status: Former Smoker    Packs/day: 1.00    Years: 31.00    Pack years: 31.00    Types: Cigarettes    Last attempt to quit: 04/04/2017    Years since quitting: 0.4  . Smokeless tobacco: Never Used  Substance Use Topics  . Alcohol use: Never    Frequency: Never  . Drug use: Never    Review of Systems Constitutional: No fever/chills Cardiovascular: Denies chest pain. Respiratory: Denies shortness of breath. Gastrointestinal: No abdominal pain.  No nausea, no vomiting.  Genitourinary: As above.  Musculoskeletal: As above. Skin: Negative for rash.   ____________________________________________   PHYSICAL EXAM:  VITAL SIGNS: ED Triage Vitals  Enc Vitals Group     BP 09/02/17 1017 95/65     Pulse Rate 09/02/17 1017 94     Resp 09/02/17 1017 18     Temp 09/02/17 1017 (!) 97.5 F (36.4 C)     Temp Source 09/02/17 1017 Oral     SpO2 09/02/17 1017 97 %     Weight 09/02/17 1012 204 lb (92.5 kg)     Height 09/02/17 1012 5\' 6"  (1.676 m)     Head Circumference --      Peak Flow --      Pain Score 09/02/17 1129 0     Pain Loc --      Pain Edu? --      Excl. in [place]? --     Constitutional: Alert and oriented. Well appearing and in no acute distress. Cardiovascular: Normal rate, regular rhythm. Grossly normal heart sounds.  Good peripheral circulation. Respiratory: Normal respiratory effort without tachypnea nor retractions. Breath sounds are clear and equal bilaterally. No wheezes, rales, rhonchi. Gastrointestinal: Soft and nontender. No CVA  tenderness. Musculoskeletal:  No midline cervical, thoracic or lumbar tenderness to palpation.  Neurologic:  Normal speech and language. Speech is normal. No gait instability.  Skin:  Skin is warm, dry and intact. No rash noted. Psychiatric: Mood and affect are normal. Speech and behavior are normal. Patient exhibits appropriate insight and judgment   ___________________________________________   LABS (all labs ordered are listed, but only abnormal results are displayed)  Labs Reviewed  URINALYSIS, COMPLETE (UACMP) WITH MICROSCOPIC - Abnormal; Notable for the following components:      Result Value   APPearance CLOUDY (*)    Bilirubin Urine SMALL (*)    Nitrite POSITIVE (*)    Leukocytes, UA MODERATE (*)    Bacteria, UA  MANY (*)    All other components within normal limits  URINE CULTURE    05/31/2017 creatinine 1.58, BUN 25. Creatinine clearance calculated.  ____________________________________________   PROCEDURES Procedures    INITIAL IMPRESSION / ASSESSMENT AND PLAN / ED COURSE  Pertinent labs & imaging results that were available during my care of the patient were reviewed by me and considered in my medical decision making (see chart for details).  Very well-appearing patient.  No acute distress.  Patient sister at bedside.  History of nighttime incontinence with recurrent UTIs, urinalysis reviewed, will culture urine and treat patient with oral Cipro.  Stop prophylactic Keflex while taking the Cipro and then resume after Cipro.  Encourage follow-up with PCP next week, patient reports has a scheduled appointment.Discussed indication, risks and benefits of medications with patient, including tendon issues with Cipro.   Discussed follow up and return parameters including no resolution or any worsening concerns. Patient verbalized understanding and agreed to plan.   ____________________________________________   FINAL CLINICAL IMPRESSION(S) / ED DIAGNOSES  Final  diagnoses:  Urinary tract infection without hematuria, site unspecified     ED Discharge Orders        Ordered    ciprofloxacin (CIPRO) 500 MG tablet  Every 12 hours     09/02/17 1120       Note: This dictation was prepared with Dragon dictation along with smaller phrase technology. Any transcriptional errors that result from this process are unintentional.         Marylene Land, NP 09/02/17 1324

## 2017-09-04 LAB — URINE CULTURE

## 2017-09-04 NOTE — Progress Notes (Signed)
Culture positive for Citrobacter Freundii, this was treated with cipro at urgent care visit. Attempted to reach patient. No answer at this time.

## 2017-10-19 ENCOUNTER — Ambulatory Visit: Payer: Medicare PPO | Admitting: Internal Medicine

## 2017-11-30 DIAGNOSIS — I679 Cerebrovascular disease, unspecified: Secondary | ICD-10-CM | POA: Insufficient documentation

## 2018-03-08 ENCOUNTER — Encounter: Payer: Self-pay | Admitting: Emergency Medicine

## 2018-03-08 ENCOUNTER — Ambulatory Visit
Admission: EM | Admit: 2018-03-08 | Discharge: 2018-03-08 | Disposition: A | Discharge status: Home / Self Care | Payer: Medicare Other | Attending: Family Medicine | Admitting: Family Medicine

## 2018-03-08 ENCOUNTER — Other Ambulatory Visit: Payer: Self-pay

## 2018-03-08 DIAGNOSIS — R300 Dysuria: Secondary | ICD-10-CM | POA: Diagnosis not present

## 2018-03-08 DIAGNOSIS — Z87440 Personal history of urinary (tract) infections: Secondary | ICD-10-CM | POA: Diagnosis not present

## 2018-03-08 LAB — URINALYSIS, COMPLETE (UACMP) WITH MICROSCOPIC
Bilirubin Urine: NEGATIVE
Glucose, UA: NEGATIVE mg/dL
Nitrite: POSITIVE — AB
SPECIFIC GRAVITY, URINE: 1.02 (ref 1.005–1.030)
SQUAMOUS EPITHELIAL / LPF: NONE SEEN (ref 0–5)
WBC, UA: >50 WBC/hpf (ref 0–5)
pH: 5.5 (ref 5.0–8.0)

## 2018-03-08 MED ORDER — CIPROFLOXACIN HCL 250 MG PO TABS: 250.0000 mg | ORAL_TABLET | Freq: Two times a day (BID) | ORAL | 0 refills | Status: DC

## 2018-03-08 NOTE — Discharge Instructions (Signed)
Return with urine.  Antibiotic as prescribed.  Take care  Dr. Lacinda Axon

## 2018-03-08 NOTE — ED Provider Notes (Signed)
[facility]    CSN: [id number] Arrival date & time: 03/08/18  1708  History   Chief Complaint Chief Complaint  Patient presents with  . Urinary Frequency  . malodorous urine   HPI  72 year old female with a history of recurrent UTI presents with concerns for UTI.  Patient reports a 2 to 3-day history of urinary frequency, malodorous urine, and dysuria.  No fever.  No chills.  She has had some back pain.  No medications or interventions tried.  Has a history of recurrent UTIs with resistant organism.  No other complaints or concerns at this time.  PMH, Surgical Hx, Family Hx, Social History reviewed and updated as below.  Past Medical History:  Diagnosis Date  . Acid reflux   . Cancer ([place])    skin ca  . Chronic kidney disease (CKD), stage III (moderate) ([place]) 12/09/2012  . 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 Current tobacco use 10/09/2010  . Essential (primary) hypertension 10/09/2010  . Hernia, rectovaginal 10/10/2012  . Mixed incontinence 10/10/2012  . Personal history of tobacco use, presenting hazards to health 02/05/2015    Patient Active Problem List   Diagnosis Date Noted  . History of closed hip dislocation 05/31/2017  . Personal history of tobacco use, presenting hazards to health 02/05/2015  . Chronic kidney disease (CKD), stage III (moderate) ([place]) 12/09/2012  . Mixed incontinence 10/10/2012  . Hernia, rectovaginal 10/10/2012  . Essential (primary) hypertension 10/09/2010  . 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 Current tobacco use 10/09/2010    Past Surgical History:  Procedure Laterality Date  . ABDOMINAL HYSTERECTOMY    . HIP CLOSED REDUCTION Right 05/31/2017   Procedure: CLOSED REDUCTION HIP;  Surgeon: Leim Fabry, MD;  Location: [facility] ORS;  Service: Orthopedics;  Laterality: Right;  . HIP SURGERY    . REPLACEMENT TOTAL KNEE BILATERAL      OB History   None      Home Medications     Prior to Admission medications   Medication Sig Start Date End Date Taking? Authorizing Provider  albuterol (PROVENTIL HFA;VENTOLIN HFA) 108 (90 Base) MCG/ACT inhaler continuous as needed. 09/06/15  Yes [provider]  aspirin 81 MG tablet Take 81 mg by mouth. 10/08/10  Yes [provider]  buPROPion (WELLBUTRIN XL) 300 MG 24 hr tablet Take 1 tablet by mouth daily. 05/28/17  Yes [provider]  cephALEXin (KEFLEX) 250 MG capsule Take 1 capsule by mouth daily. 05/28/17  Yes [provider]  docusate sodium (STOOL SOFTENER) 100 MG capsule Take 100 mg by mouth.   Yes [provider]  DULoxetine (CYMBALTA) 60 MG capsule Take 1 capsule by mouth daily. 05/12/17  Yes [provider]  HYDROcodone-acetaminophen (NORCO/VICODIN) 5-325 MG tablet Take 1 tablet by mouth every 4 (four) hours as needed. 06/01/17  Yes Reche Dixon, PA-C  Magnesium 250 MG TABS Take 250 mg by mouth daily.  10/08/10  Yes [provider]  nystatin cream (MYCOSTATIN) continuous as needed. 07/09/14  Yes [provider]  omeprazole (PRILOSEC) 40 MG capsule Take 40 mg by mouth daily.  09/08/12  Yes [provider]  potassium chloride (KLOR-CON 10) 10 MEQ tablet Take 10 mEq by mouth daily.  10/08/10  Yes [provider]  simvastatin (ZOCOR) 20 MG tablet Take 20 mg by mouth at bedtime.  04/17/15  Yes [provider]  Vitamin D, Ergocalciferol, (DRISDOL) 50000 units CAPS capsule Take 50,000 Units by mouth every 7 (  seven) days.    Yes [provider]  ciprofloxacin (CIPRO) 250 MG tablet Take 1 tablet (250 mg total) by mouth every 12 (twelve) hours. 03/08/18   Coral Spikes, DO    Family History Family History  Problem Relation Age of Onset  . Breast cancer Sister 12  . Breast cancer Maternal Aunt   . Breast cancer Cousin 55       paternal  . Prostate cancer Maternal Grandfather   . Alzheimer's disease Mother   . Diabetes Mother   .  COPD Father   . Diabetes Father   . Bladder Cancer Neg Hx   . Kidney cancer Neg Hx     Social History Social History   Tobacco Use  . Smoking status: Former Smoker    Packs/day: 1.00    Years: 31.00    Pack years: 31.00    Types: Cigarettes    Last attempt to quit: 04/04/2017    Years since quitting: 0.9  . Smokeless tobacco: Never Used  Substance Use Topics  . Alcohol use: Never    Frequency: Never  . Drug use: Never     Allergies   Beta adrenergic blockers; Hydrochlorothiazide; Sulfathiazole; and Thiazide-type diuretics   Review of Systems Review of Systems  Constitutional: Negative for chills and fever.  Genitourinary: Positive for dysuria and frequency.  Musculoskeletal: Positive for back pain.   Physical Exam Triage Vital Signs ED Triage Vitals  Enc Vitals Group     BP 03/08/18 1737 133/88     Pulse Rate 03/08/18 1737 88     Resp 03/08/18 1737 16     Temp 03/08/18 1737 98.4 F (36.9 C)     Temp Source 03/08/18 1737 Oral     SpO2 03/08/18 1737 100 %     Weight 03/08/18 1738 210 lb (95.3 kg)     Height 03/08/18 1738 5\' 6"  (1.676 m)     Head Circumference --      Peak Flow --      Pain Score 03/08/18 1737 4     Pain Loc --      Pain Edu? --    Updated Vital Signs BP 133/88 (BP Location: Left Arm)   Pulse 88   Temp 98.4 F (36.9 C) (Oral)   Resp 16   Ht 5\' 6"  (1.676 m)   Wt 95.3 kg   SpO2 100%   BMI 33.89 kg/m   Visual Acuity Right Eye Distance:   Left Eye Distance:   Bilateral Distance:    Right Eye Near:   Left Eye Near:    Bilateral Near:     Physical Exam  Constitutional: She is oriented to person, place, and time. She appears well-developed. No distress.  HENT:  Head: Normocephalic and atraumatic.  Cardiovascular: Normal rate and regular rhythm.  Pulmonary/Chest: Effort normal and breath sounds normal. She has no wheezes. She has no rales.  Abdominal: Soft. She exhibits no distension. There is no tenderness.  Neurological: She is  alert and oriented to person, place, and time.  Psychiatric: She has a normal mood and affect. Her behavior is normal.  Nursing note and vitals reviewed.  UC Treatments / Results  Labs (all labs ordered are listed, but only abnormal results are displayed) Labs Reviewed  URINALYSIS, COMPLETE (UACMP) WITH MICROSCOPIC - Abnormal; Notable for the following components:      Result Value   APPearance CLOUDY (*)    Hgb urine dipstick TRACE (*)    Ketones, ur  TRACE (*)    Protein, ur TRACE (*)    Nitrite POSITIVE (*)    Leukocytes, UA MODERATE (*)    Bacteria, UA MANY (*)    All other components within normal limits  URINE CULTURE    EKG None  Radiology No results found.  Procedures Procedures (including critical care time)  Medications Ordered in UC Medications - No data to display  Initial Impression / Assessment and Plan / UC Course  I have reviewed the triage vital signs and the nursing notes.  Pertinent labs & imaging results that were available during my care of the patient were reviewed by me and considered in my medical decision making (see chart for details).    72 year old female presents with UTI.  Urinalysis consistent with UTI.  Sending culture.  Placing on Cipro.  Patient has a history of resistant organisms and is allergic to Bactrim.  Based on her last culture, this is the best drug at this time.  Additionally, Macrobid not recommended in the elderly.  Her last culture was resistant to cephalosporins.  Final Clinical Impressions(s) / UC Diagnoses   Final diagnoses:  Dysuria     Discharge Instructions     Return with urine.  Antibiotic as prescribed.  Take care  Dr. Lacinda Axon    ED Prescriptions    Medication Sig Dispense Auth. Provider   ciprofloxacin (CIPRO) 250 MG tablet Take 1 tablet (250 mg total) by mouth every 12 (twelve) hours. 14 tablet Coral Spikes, DO     Controlled Substance Prescriptions [place] Controlled Substance Registry consulted? Not  Applicable   Coral Spikes, DO 03/08/18 1934

## 2018-03-08 NOTE — ED Triage Notes (Signed)
Patient in today c/o urinary frequency and foul odor to her urine x 2 days. Patient has a history of UTIs. Patient denies fever. Patient has not tried any OTC medications.

## 2018-03-11 LAB — URINE CULTURE
Culture: >=100000 — AB
Special Requests: NORMAL

## 2018-03-19 ENCOUNTER — Telehealth: Payer: Self-pay

## 2018-03-19 NOTE — Telephone Encounter (Signed)
Call pt regarding lung screening. Left pt message to return call.

## 2018-03-26 ENCOUNTER — Telehealth: Payer: Self-pay

## 2018-03-26 NOTE — Telephone Encounter (Signed)
Call pt regarding lung screening. Left message for pt to return call.  

## 2018-04-01 ENCOUNTER — Telehealth: Payer: Self-pay | Admitting: *Deleted

## 2018-04-01 NOTE — Telephone Encounter (Signed)
Left message for patient to notify them that it is time to schedule annual low dose lung cancer screening CT scan. Instructed patient to call back to verify information prior to the scan being scheduled.  

## 2018-04-04 ENCOUNTER — Emergency Department: Payer: Medicare Other

## 2018-04-04 ENCOUNTER — Encounter: Payer: Self-pay | Admitting: Emergency Medicine

## 2018-04-04 ENCOUNTER — Ambulatory Visit
Admission: EM | Admit: 2018-04-04 | Discharge: 2018-04-05 | Disposition: A | Discharge status: Home / Self Care | Payer: Medicare Other | Attending: Emergency Medicine | Admitting: Emergency Medicine

## 2018-04-04 DIAGNOSIS — G629 Polyneuropathy, unspecified: Secondary | ICD-10-CM | POA: Diagnosis not present

## 2018-04-04 DIAGNOSIS — E7801 Familial hypercholesterolemia: Secondary | ICD-10-CM | POA: Diagnosis not present

## 2018-04-04 DIAGNOSIS — Z9889 Other specified postprocedural states: Secondary | ICD-10-CM

## 2018-04-04 DIAGNOSIS — M8580 Other specified disorders of bone density and structure, unspecified site: Secondary | ICD-10-CM | POA: Insufficient documentation

## 2018-04-04 DIAGNOSIS — Z96653 Presence of artificial knee joint, bilateral: Secondary | ICD-10-CM | POA: Insufficient documentation

## 2018-04-04 DIAGNOSIS — Z90710 Acquired absence of both cervix and uterus: Secondary | ICD-10-CM | POA: Insufficient documentation

## 2018-04-04 DIAGNOSIS — F0390 Unspecified dementia without behavioral disturbance: Secondary | ICD-10-CM | POA: Diagnosis not present

## 2018-04-04 DIAGNOSIS — N183 Chronic kidney disease, stage 3 (moderate): Secondary | ICD-10-CM | POA: Insufficient documentation

## 2018-04-04 DIAGNOSIS — E785 Hyperlipidemia, unspecified: Secondary | ICD-10-CM | POA: Diagnosis not present

## 2018-04-04 DIAGNOSIS — W06XXXA Fall from bed, initial encounter: Secondary | ICD-10-CM | POA: Insufficient documentation

## 2018-04-04 DIAGNOSIS — Z7982 Long term (current) use of aspirin: Secondary | ICD-10-CM | POA: Insufficient documentation

## 2018-04-04 DIAGNOSIS — T84020A Dislocation of internal right hip prosthesis, initial encounter: Secondary | ICD-10-CM | POA: Diagnosis not present

## 2018-04-04 DIAGNOSIS — N816 Rectocele: Secondary | ICD-10-CM | POA: Insufficient documentation

## 2018-04-04 DIAGNOSIS — S73004A Unspecified dislocation of right hip, initial encounter: Secondary | ICD-10-CM | POA: Diagnosis present

## 2018-04-04 DIAGNOSIS — R32 Unspecified urinary incontinence: Secondary | ICD-10-CM | POA: Diagnosis not present

## 2018-04-04 DIAGNOSIS — I129 Hypertensive chronic kidney disease with stage 1 through stage 4 chronic kidney disease, or unspecified chronic kidney disease: Secondary | ICD-10-CM | POA: Diagnosis not present

## 2018-04-04 DIAGNOSIS — Z87891 Personal history of nicotine dependence: Secondary | ICD-10-CM | POA: Insufficient documentation

## 2018-04-04 DIAGNOSIS — K219 Gastro-esophageal reflux disease without esophagitis: Secondary | ICD-10-CM | POA: Insufficient documentation

## 2018-04-04 DIAGNOSIS — Z79899 Other long term (current) drug therapy: Secondary | ICD-10-CM | POA: Diagnosis not present

## 2018-04-04 DIAGNOSIS — Z9181 History of falling: Secondary | ICD-10-CM | POA: Insufficient documentation

## 2018-04-04 HISTORY — DX: Vascular dementia, unspecified severity, without behavioral disturbance, psychotic disturbance, mood disturbance, and anxiety: F01.50

## 2018-04-04 MED ORDER — PROPOFOL 10 MG/ML IV BOLUS
INTRAVENOUS | Status: AC
  Filled 2018-04-04: qty 20

## 2018-04-04 MED ORDER — ONDANSETRON HCL 4 MG/2ML IJ SOLN
INTRAMUSCULAR | Status: AC
  Filled 2018-04-04: qty 2

## 2018-04-04 MED ORDER — ONDANSETRON HCL 4 MG/2ML IJ SOLN
4.0000 mg | INTRAMUSCULAR | Status: AC
  Administered 2018-04-04: 4 mg via INTRAVENOUS

## 2018-04-04 MED ORDER — PROPOFOL 10 MG/ML IV BOLUS
75.0000 mg | Freq: Once | INTRAVENOUS | Status: AC
  Administered 2018-04-04: 75 mg via INTRAVENOUS

## 2018-04-04 MED ORDER — PROPOFOL 10 MG/ML IV BOLUS: 75.0000 mg | Freq: Once | INTRAVENOUS | Status: DC

## 2018-04-04 MED ORDER — FENTANYL CITRATE (PF) 100 MCG/2ML IJ SOLN
INTRAMUSCULAR | Status: AC
  Administered 2018-04-04: 25 ug
  Filled 2018-04-04: qty 2

## 2018-04-04 NOTE — ED Provider Notes (Signed)
[facility] Emergency Department Provider Note  ____________________________________________   First MD Initiated Contact with Patient 04/04/18 2256     (approximate)  I have reviewed the triage vital signs and the nursing notes.   HISTORY  Chief Complaint Hip Pain    HPI Nancy Perkins is a 72 y.o. female with a history that includes prior bilateral hip replacements and prior subsequent dislocation of the hip requiring reduction in the operating room.  She presents tonight after acute onset of severe pain and concern for repeat dislocation of the right hip.  She states that she was taking a step onto her off of something when she felt acute onset of pain and felt the hip dislocate.  Her daughter was able to help her to the ground so she did not fall.  She has no pain except that in her right hip.  She is still able to wiggle her toes and has no numbness nor tingling.  It feels just like it did last time.  The pain is severe in any attempts at movement make it worse.  Nothing makes it better.  Past Medical History:  Diagnosis Date  . Acid reflux   . Cancer ([place])    skin ca  . Chronic kidney disease (CKD), stage III (moderate) ([place]) 12/09/2012  . 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 Current tobacco use 10/09/2010  . Essential (primary) hypertension 10/09/2010  . Hernia, rectovaginal 10/10/2012  . Mixed incontinence 10/10/2012  . Personal history of tobacco use, presenting hazards to health 02/05/2015  . Vascular dementia [facility])     Patient Active Problem List   Diagnosis Date Noted  . History of closed hip dislocation 05/31/2017  . Personal history of tobacco use, presenting hazards to health 02/05/2015  . Chronic kidney disease (CKD), stage III (moderate) ([place]) 12/09/2012  . Mixed incontinence 10/10/2012  . Hernia, rectovaginal 10/10/2012  . Essential (primary) hypertension 10/09/2010  . Combined fat and carbohydrate induced  hyperlipemia 10/09/2010  . Current tobacco use 10/09/2010    Past Surgical History:  Procedure Laterality Date  . ABDOMINAL HYSTERECTOMY    . HIP CLOSED REDUCTION Right 05/31/2017   Procedure: CLOSED REDUCTION HIP;  Surgeon: Leim Fabry, MD;  Location: [facility] ORS;  Service: Orthopedics;  Laterality: Right;  . HIP SURGERY    . REPLACEMENT TOTAL KNEE BILATERAL      Prior to Admission medications   Medication Sig Start Date End Date Taking? Authorizing Provider  albuterol (PROVENTIL HFA;VENTOLIN HFA) 108 (90 Base) MCG/ACT inhaler continuous as needed. 09/06/15   [provider]  aspirin 81 MG tablet Take 81 mg by mouth. 10/08/10   [provider]  buPROPion (WELLBUTRIN XL) 300 MG 24 hr tablet Take 1 tablet by mouth daily. 05/28/17   [provider]  cephALEXin (KEFLEX) 250 MG capsule Take 1 capsule by mouth daily. 05/28/17   [provider]  ciprofloxacin (CIPRO) 250 MG tablet Take 1 tablet (250 mg total) by mouth every 12 (twelve) hours. 03/08/18   Coral Spikes, DO  docusate sodium (STOOL SOFTENER) 100 MG capsule Take 100 mg by mouth.    [provider]  DULoxetine (CYMBALTA) 60 MG capsule Take 1 capsule by mouth daily. 05/12/17   [provider]  HYDROcodone-acetaminophen (NORCO/VICODIN) 5-325 MG tablet Take 1 tablet by mouth every 4 (four) hours as needed. 06/01/17   Reche Dixon, PA-C  Magnesium 250 MG TABS Take 250 mg by mouth daily.  10/08/10  [provider]  nystatin cream (MYCOSTATIN) continuous as needed. 07/09/14   [provider]  omeprazole (PRILOSEC) 40 MG capsule Take 40 mg by mouth daily.  09/08/12   [provider]  potassium chloride (KLOR-CON 10) 10 MEQ tablet Take 10 mEq by mouth daily.  10/08/10   [provider]  simvastatin (ZOCOR) 20 MG tablet Take 20 mg by mouth at bedtime.  04/17/15   [provider]  Vitamin D, Ergocalciferol, (DRISDOL) 50000 units CAPS capsule Take 50,000 Units by  mouth every 7 (seven) days.     [provider]    Allergies Beta adrenergic blockers; Hydrochlorothiazide; Sulfathiazole; and Thiazide-type diuretics  Family History  Problem Relation Age of Onset  . Breast cancer Sister 60  . Breast cancer Maternal Aunt   . Breast cancer Cousin 3       paternal  . Prostate cancer Maternal Grandfather   . Alzheimer's disease Mother   . Diabetes Mother   . COPD Father   . Diabetes Father   . Bladder Cancer Neg Hx   . Kidney cancer Neg Hx     Social History Social History   Tobacco Use  . Smoking status: Former Smoker    Packs/day: 1.00    Years: 31.00    Pack years: 31.00    Types: Cigarettes    Last attempt to quit: 04/04/2017    Years since quitting: 1.0  . Smokeless tobacco: Never Used  Substance Use Topics  . Alcohol use: Never    Frequency: Never  . Drug use: Never    Review of Systems Constitutional: No fever/chills Eyes: No visual changes. ENT: No sore throat. Cardiovascular: Denies chest pain. Respiratory: Denies shortness of breath. Gastrointestinal: No abdominal pain.  No nausea, no vomiting.  No diarrhea.  No constipation. Genitourinary: Negative for dysuria. Musculoskeletal: Right hip pain as described above.  Negative for neck pain.  Negative for back pain. Integumentary: Negative for rash. Neurological: Negative for headaches, focal weakness or numbness.   ____________________________________________   PHYSICAL EXAM:  ED Triage Vitals  Enc Vitals Group     BP 04/04/18 2341 (!) 146/76     Pulse Rate 04/04/18 2341 98     Resp 04/04/18 2341 14     Temp 04/05/18 0315 (P) 97.7 F (36.5 C)     Temp src --      SpO2 04/04/18 2341 98 %     Weight 04/04/18 2229 93 kg (205 lb)     Height 04/04/18 2229 1.651 m (5\' 5" )     Head Circumference --      Peak Flow --      Pain Score 04/04/18 2229 6     Pain Loc --      Pain Edu? --      Excl. in [place]? --      Constitutional: Alert and oriented.   Appears to be in moderate to severe pain. Eyes: Conjunctivae are normal.  Head: Atraumatic. Nose: No congestion/rhinnorhea. Mouth/Throat: Mucous membranes are moist. Neck: No stridor.  No meningeal signs.   Cardiovascular: Normal rate, regular rhythm. Good peripheral circulation. Grossly normal heart sounds. Respiratory: Normal respiratory effort.  No retractions. Lungs CTAB. Gastrointestinal: Soft and nontender. No distention.  Musculoskeletal: Large lower extremity body habitus somewhat limits the exam but the patient has severe tenderness to palpation with any manipulation of the right hip.  Neurovascularly intact distally, able to wiggle toes. Neurologic:  Normal speech and language. No gross focal neurologic deficits are  appreciated.  Skin:  Skin is warm, dry and intact. No rash noted. Psychiatric: Mood and affect are normal under the circumstances.  ____________________________________________   LABS (all labs ordered are listed, but only abnormal results are displayed)  Labs Reviewed  BASIC METABOLIC PANEL - Abnormal; Notable for the following components:      Result Value   Glucose, Bld 108 (*)    BUN 41 (*)    Creatinine, Ser 1.88 (*)    GFR calc non Af Amer 25 (*)    GFR calc Af Amer 29 (*)    All other components within normal limits  CBC WITH DIFFERENTIAL/PLATELET  PROTIME-INR   ____________________________________________  EKG  No indication for EKG ____________________________________________  RADIOLOGY I, Hinda Kehr, personally viewed and evaluated these images (plain radiographs) as part of my medical decision making, as well as reviewing the written report by the radiologist.  ED MD interpretation: Initial radiograph demonstrated a superior dislocation of the right hip prosthesis.  Postreduction attempt films now demonstrate anterior/medially dislocated femoral head.  Official radiology report(s): Dg Hip Port Unilat W Or Wo Pelvis 1 View Right  Result  Date: 04/05/2018 CLINICAL DATA:  Arthroplasty dislocation, postreduction. EXAM: DG HIP (WITH OR WITHOUT PELVIS) 1V PORT RIGHT COMPARISON:  Radiographs earlier this day. FINDINGS: Femoral head component of right hip arthroplasty remains dislocated, however now appears anterior to the acetabular cup. No visualized fracture. IMPRESSION: Unsuccessful postreduction with femoral head component of right hip arthroplasty dislocated from the acetabular cup, however femoral head now appears anteriorly/medially dislocated (previously superiorly). Electronically Signed   By: Keith Rake M.D.   On: 04/05/2018 01:19   Dg Hip Unilat W Or Wo Pelvis 2-3 Views Right  Result Date: 04/04/2018 CLINICAL DATA:  Fall out of bed. Right hip pain. Initial encounter. EXAM: DG HIP (WITH OR WITHOUT PELVIS) 2-3V RIGHT COMPARISON:  05/31/2017 FINDINGS: Superior dislocation of bipolar right hip prosthesis is again seen. No evidence of acute fracture involving the proximal femur or pelvis. Generalized osteopenia and peripheral vascular calcification again noted. IMPRESSION: Superior dislocation of right hip prosthesis. No evidence of acute fracture. Electronically Signed   By: Earle Gell M.D.   On: 04/04/2018 23:18    ____________________________________________   PROCEDURES  Critical Care performed: No   Procedure(s) performed:   .Sedation Date/Time: 04/04/2018 11:31 PM Performed by: Hinda Kehr, MD Authorized by: Hinda Kehr, MD   Consent:    Consent obtained:  Written and verbal   Consent given by:  Patient and healthcare agent   Risks discussed:  Allergic reaction, dysrhythmia, inadequate sedation, nausea, prolonged hypoxia resulting in organ damage, prolonged sedation necessitating reversal, respiratory compromise necessitating ventilatory assistance and intubation and vomiting   Alternatives discussed:  Analgesia without sedation, anxiolysis and regional anesthesia Universal protocol:    Procedure  explained and questions answered to patient or proxy's satisfaction: yes     Relevant documents present and verified: yes     Test results available and properly labeled: yes     Imaging studies available: yes     Required blood products, implants, devices, and special equipment available: yes     Site/side marked: yes     Immediately prior to procedure a time out was called: yes     Patient identity confirmation method:  Verbally with patient and arm band Indications:    Procedure performed:  Dislocation reduction   Procedure necessitating sedation performed by:  Physician performing sedation Pre-sedation assessment:    Time since last food or drink:  4.5 hours (19:00)   ASA classification: class 2 - patient with mild systemic disease     Neck mobility: normal     Mouth opening:  3 or more finger widths   Thyromental distance:  4 finger widths   Mallampati score:  II - soft palate, uvula, fauces visible   Pre-sedation assessments completed and reviewed: airway patency, cardiovascular function, hydration status, mental status, nausea/vomiting, pain level, respiratory function and temperature     Pre-sedation assessment completed:  04/04/2018 11:33 PM Immediate pre-procedure details:    Reassessment: Patient reassessed immediately prior to procedure     Reviewed: vital signs, relevant labs/tests and NPO status     Verified: bag valve mask available, emergency equipment available, intubation equipment available, IV patency confirmed, oxygen available and suction available   Procedure details (see MAR for exact dosages):    Preoxygenation:  Nasal cannula   Sedation:  Propofol   Analgesia:  Fentanyl (25 mcg given while preparing for sedation due to patient's severe pain)   Intra-procedure monitoring:  Blood pressure monitoring, cardiac monitor, continuous pulse oximetry, frequent LOC assessments, frequent vital sign checks and continuous capnometry   Intra-procedure events: none     Total  Provider sedation time (minutes):  9 Post-procedure details:    Post-sedation assessment completed:  04/05/2018 12:16 AM   Attendance: Constant attendance by certified staff until patient recovered     Recovery: Patient returned to pre-procedure baseline     Post-sedation assessments completed and reviewed: airway patency, cardiovascular function, hydration status, mental status, nausea/vomiting, pain level, respiratory function and temperature     Patient is stable for discharge or admission: yes     Patient tolerance:  Tolerated well, no immediate complications Reduction of dislocation Date/Time: 04/04/2018 11:41 PM Performed by: Hinda Kehr, MD Authorized by: Hinda Kehr, MD  Unsuccessful attempt Consent: Verbal consent obtained. Written consent obtained. Consent given by: patient and power of attorney Patient understanding: patient states understanding of the procedure being performed Patient consent: the patient's understanding of the procedure matches consent given Procedure consent: procedure consent matches procedure scheduled Relevant documents: relevant documents present and verified Test results: test results available and properly labeled Site marked: the operative site was marked Imaging studies: imaging studies available Required items: required blood products, implants, devices, and special equipment available Patient identity confirmed: verbally with patient and arm band Time out: Immediately prior to procedure a "time out" was called to verify the correct patient, procedure, equipment, support staff and site/side marked as required.  Sedation: Patient sedated: yes (see separate sedation note)  Patient tolerance: Patient tolerated the procedure well with no immediate complications Comments: Repeat radiographs indicate an inferior dislocation now rather than an superior dislocation as before.      ____________________________________________   INITIAL  IMPRESSION / ASSESSMENT AND PLAN / ED COURSE  As part of my medical decision making, I reviewed the following data within the [place] History obtained from family, Labs reviewed , Old chart reviewed, A consult was requested and obtained from this/these consultant(s) Orthopedics and Notes from prior ED visits    Patient has superior dislocation of right hip prosthesis.  After having a risk-benefit discussion with the patient and her daughter who is reportedly her healthcare power of attorney, we agreed to attempt deep sedation and reduction in the emergency department.  Based on my review of her past medical record, if I am successful with a reduction she can be placed in a knee immobilizer and discharged for outpatient  follow-up.  Otherwise she may require reduction under anesthesia in the operating room.  The patient and daughter provided both verbal and written consent for both the sedation and the reduction process and I shared with them the usual and customary risks and benefits of both procedures.  As described in the procedure note above, I felt a "clunk" of the joint when attempting reduction by the [place] maneuver; prior attempts using the [employer] and [place] maneuvers were unsuccessful.  However, even after the "clunk", the patient continued to moan, which suggests to me that the reduction may not have been successful.  I am awaiting postreduction films.  The patient has recovered from anesthesia well but still has severe pain.  Prior to the procedure I gave her fentanyl 25 mcg IV and I have authorized another fentanyl 25 mcg IV in addition to the propofol 75 mg given for the sedation (I provided a dose of 80% of the recommended 1 mg/kg based on her age and health status).  Clinical Course as of Apr 05 322  Tue Apr 05, 2018  0027 Reduction was unsuccessful, verified by me at bedside after post-reduction attempt radiographs.  Paging Dr. Marry Guan to discuss.   [CF]  [phone]  Was talking with Dr. Marry Guan but had to hang up due to code blue in MRI.  Paging Dr. Marry Guan again.   [CF]  0102 Spoke with Dr. Marry Guan who will take the patient to the OR.  Patient to remain NPO.  Basic   [CF]    Clinical Course User Index [CF] Hinda Kehr, MD    ____________________________________________  FINAL CLINICAL IMPRESSION(S) / ED DIAGNOSES  Final diagnoses:  Closed dislocation of hip, right, initial encounter [facility])     MEDICATIONS GIVEN DURING THIS VISIT:  Medications  fentaNYL (SUBLIMAZE) 100 MCG/2ML injection (25 mcg  Given 04/04/18 2336)  ondansetron (ZOFRAN) injection 4 mg (4 mg Intravenous Given 04/04/18 2335)  propofol (DIPRIVAN) 10 mg/mL bolus/IV push 75 mg (75 mg Intravenous Given 04/04/18 2351)  fentaNYL (SUBLIMAZE) injection 25 mcg (25 mcg Intravenous Given 04/05/18 0106)     ED Discharge Orders    None       Note:  This document was prepared using Dragon voice recognition software and may include unintentional dictation errors.    Hinda Kehr, MD 04/05/18 [phone]

## 2018-04-04 NOTE — ED Triage Notes (Signed)
Pt arrived to ED from home where pt was getting in bed and felt right hip dislocate. Pt had same occurrence in January where pt went to OR for replacement. Pt has bilateral hip and knee replacement.

## 2018-04-05 ENCOUNTER — Emergency Department: Payer: Medicare Other | Admitting: Anesthesiology

## 2018-04-05 ENCOUNTER — Emergency Department: Payer: Medicare Other

## 2018-04-05 ENCOUNTER — Encounter
Admission: EM | Disposition: A | Discharge status: Home / Self Care | Payer: Self-pay | Source: Home / Self Care | Attending: Emergency Medicine

## 2018-04-05 ENCOUNTER — Other Ambulatory Visit: Payer: Self-pay

## 2018-04-05 ENCOUNTER — Encounter: Payer: Self-pay | Admitting: Orthopedic Surgery

## 2018-04-05 DIAGNOSIS — Z9889 Other specified postprocedural states: Secondary | ICD-10-CM

## 2018-04-05 HISTORY — PX: HIP CLOSED REDUCTION: SHX983

## 2018-04-05 LAB — BASIC METABOLIC PANEL
Anion gap: 9 (ref 5–15)
BUN: 41 mg/dL — AB (ref 8–23)
CALCIUM: 10 mg/dL (ref 8.9–10.3)
CO2: 23 mmol/L (ref 22–32)
CREATININE: 1.88 mg/dL — AB (ref 0.44–1.00)
Chloride: 108 mmol/L (ref 98–111)
GFR calc non Af Amer: 25 mL/min — ABNORMAL LOW (ref >60)
GFR, EST AFRICAN AMERICAN: 29 mL/min — AB (ref >60)
Glucose, Bld: 108 mg/dL — ABNORMAL HIGH (ref 70–99)
Potassium: 4.4 mmol/L (ref 3.5–5.1)
Sodium: 140 mmol/L (ref 135–145)

## 2018-04-05 LAB — CBC WITH DIFFERENTIAL/PLATELET
Abs Immature Granulocytes: 0.03 10*3/uL (ref 0.00–0.07)
BASOS ABS: 0.1 10*3/uL (ref 0.0–0.1)
Basophils Relative: 1 %
EOS ABS: 0.1 10*3/uL (ref 0.0–0.5)
EOS PCT: 1 %
HEMATOCRIT: 40.2 % (ref 36.0–46.0)
Hemoglobin: 13 g/dL (ref 12.0–15.0)
Immature Granulocytes: 0 %
LYMPHS ABS: 1.5 10*3/uL (ref 0.7–4.0)
Lymphocytes Relative: 17 %
MCH: 31 pg (ref 26.0–34.0)
MCHC: 32.3 g/dL (ref 30.0–36.0)
MCV: 95.9 fL (ref 80.0–100.0)
Monocytes Absolute: 0.6 10*3/uL (ref 0.1–1.0)
Monocytes Relative: 6 %
NRBC: 0 % (ref 0.0–0.2)
Neutro Abs: 6.6 10*3/uL (ref 1.7–7.7)
Neutrophils Relative %: 75 %
Platelets: 221 10*3/uL (ref 150–400)
RBC: 4.19 MIL/uL (ref 3.87–5.11)
RDW: 12.4 % (ref 11.5–15.5)
WBC: 8.9 10*3/uL (ref 4.0–10.5)

## 2018-04-05 LAB — PROTIME-INR
INR: 1.03
Prothrombin Time: 13.4 seconds (ref 11.4–15.2)

## 2018-04-05 SURGERY — CLOSED REDUCTION, HIP
Anesthesia: General | Site: Hip | Laterality: Right

## 2018-04-05 MED ORDER — METOCLOPRAMIDE HCL 5 MG/ML IJ SOLN: 5.0000 mg | Freq: Three times a day (TID) | INTRAMUSCULAR | Status: DC | PRN

## 2018-04-05 MED ORDER — FENTANYL CITRATE (PF) 100 MCG/2ML IJ SOLN
25.0000 ug | Freq: Once | INTRAMUSCULAR | Status: AC
  Administered 2018-04-05: 25 ug via INTRAVENOUS

## 2018-04-05 MED ORDER — HYDROCODONE-ACETAMINOPHEN 5-325 MG PO TABS
1.0000 | ORAL_TABLET | ORAL | Status: DC | PRN
  Administered 2018-04-05: 1 via ORAL
  Filled 2018-04-05: qty 1

## 2018-04-05 MED ORDER — PROPOFOL 10 MG/ML IV BOLUS
INTRAVENOUS | Status: DC | PRN
  Administered 2018-04-05: 140 mg via INTRAVENOUS

## 2018-04-05 MED ORDER — LACTATED RINGERS IV SOLN: INTRAVENOUS | Status: DC | PRN

## 2018-04-05 MED ORDER — ONDANSETRON HCL 4 MG/2ML IJ SOLN: 4.0000 mg | Freq: Four times a day (QID) | INTRAMUSCULAR | Status: DC | PRN

## 2018-04-05 MED ORDER — SODIUM CHLORIDE 0.9 % IV SOLN
INTRAVENOUS | Status: DC | PRN
  Administered 2018-04-05: 03:00:00 via INTRAVENOUS

## 2018-04-05 MED ORDER — METOCLOPRAMIDE HCL 10 MG PO TABS: 5.0000 mg | ORAL_TABLET | Freq: Three times a day (TID) | ORAL | Status: DC | PRN

## 2018-04-05 MED ORDER — ONDANSETRON HCL 4 MG PO TABS: 4.0000 mg | ORAL_TABLET | Freq: Four times a day (QID) | ORAL | Status: DC | PRN

## 2018-04-05 MED ORDER — SUCCINYLCHOLINE CHLORIDE 20 MG/ML IJ SOLN
INTRAMUSCULAR | Status: DC | PRN
  Administered 2018-04-05: 80 mg via INTRAVENOUS

## 2018-04-05 MED ORDER — ONDANSETRON HCL 4 MG/2ML IJ SOLN: 4.0000 mg | Freq: Once | INTRAMUSCULAR | Status: DC | PRN

## 2018-04-05 MED ORDER — FENTANYL CITRATE (PF) 100 MCG/2ML IJ SOLN: 25.0000 ug | INTRAMUSCULAR | Status: DC | PRN

## 2018-04-05 NOTE — Anesthesia Post-op Follow-up Note (Signed)
Anesthesia QCDR form completed.        

## 2018-04-05 NOTE — Discharge Instructions (Signed)
Knee immobilizer to be worn at all times Tylenol for pain May weight-bear as tolerated Follow-up with her orthopedist in 3 weeks Ice to right groin as needed May shower Regular diet

## 2018-04-05 NOTE — Progress Notes (Signed)
Discharge instructions given to pt. IVs removed. Pt has no prescriptions. Pt dressed. Will discharge home with daughter

## 2018-04-05 NOTE — Evaluation (Signed)
Physical Therapy Evaluation Patient Details Name: Nancy Perkins MRN: [id number] DOB: 1945-07-02 Today's Date: 04/05/2018   History of Present Illness   72 y/o female here after dislocating hip trying to get into bed.  She had h/o of R hip dislocation with subsequent reduction under anesthesia on 05/31/17. 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 Other significant history includes skin cancer, CKD, HTN, B TKR, and B THA.   Clinical Impression  Pt is able to ambulate limited distances with walker, though she has choppy, hesitant ambulation at baseline.  Gait did improve with direct assist and gait training/cuing.  She was able to slowly, but safely negotiate up/down steps with CGA.  Pt could not recall 2/3 precautions even with cuing, daughter aware of them and involved t/o session.  Pt should be able to return home but will need HHPT and likely some increased assist at least initially.     Follow Up Recommendations Home health PT    Equipment Recommendations       Recommendations for Other Services Rehab consult     Precautions / Restrictions Restrictions Weight Bearing Restrictions: Yes RLE Weight Bearing: Weight bearing as tolerated      Mobility  Bed Mobility Overal bed mobility: Modified Independent             General bed mobility comments: heavily reliant on bed rails (has them at home) and needed extra time, but was able to get to sitting w/o assist and w/o breaking precautions  Transfers Overall transfer level: Modified independent Equipment used: Rolling walker (2 wheeled)             General transfer comment: Pt needed reminders for set-up, hand placement, occasionally needed light assist to keep weight forward though she could rise w/o direct physical assist with a lot of cuing and encouragement  Ambulation/Gait Ambulation/Gait assistance: Min guard Gait Distance (Feet): 40 Feet Assistive device: Rolling walker (2 wheeled)        General Gait Details: Pt with shuffling, cautious gait that improved with direct assist to advance walker and encourage larger cadence, however self selected gait was choppy, inefficient (near baseline, per daughter)  Stairs Stairs: Yes Stairs assistance: Min guard Stair Management: One rail Left Number of Stairs: 4 General stair comments: Pt able to negotiate up/down steps w/o heavy phyiscal assist, though she needed close supervision and encouragement/explanation t/o the effort.  Pt had difficulty clearing L heel while descending despite a lot of VCs  Wheelchair Mobility    Modified Rankin (Stroke Patients Only)       Balance Overall balance assessment: Modified Independent                                           Pertinent Vitals/Pain Pain Assessment: 0-10 Pain Score: 5  Pain Location: R hip    Home Living Family/patient expects to be discharged to:: Private residence Living Arrangements: Children Available Help at Discharge: Available PRN/intermittently(daughter works, out of home during day) Type of Home: House Home Access: Stairs to enter Entrance Stairs-Rails: Left Entrance Stairs-Number of Steps: 3 Home Layout: One level Home Equipment: Environmental consultant - 4 wheels;Bedside commode;Walker - 2 wheels      Prior Function Level of Independence: Independent               Hand Dominance        Extremity/Trunk Assessment  Upper Extremity Assessment Upper Extremity Assessment: Generalized weakness    Lower Extremity Assessment Lower Extremity Assessment: Generalized weakness(unable to lift R LE, did show some strength with mobility)       Communication   Communication: No difficulties  Cognition Arousal/Alertness: Awake/alert Behavior During Therapy: WFL for tasks assessed/performed Overall Cognitive Status: History of cognitive impairments - at baseline                                 General Comments: Pt with some  confusion t/o session, able to name just 1/3 hip precautions      General Comments      Exercises General Exercises - Lower Extremity Ankle Circles/Pumps: AROM;10 reps Hip ABduction/ADduction: Strengthening;10 reps Straight Leg Raises: AAROM;5 reps   Assessment/Plan    PT Assessment Patient needs continued PT services  PT Problem List Decreased strength;Decreased range of motion;Decreased activity tolerance;Decreased balance;Decreased mobility;Decreased coordination;Decreased cognition;Decreased knowledge of use of DME;Decreased safety awareness;Decreased knowledge of precautions       PT Treatment Interventions Gait training;Therapeutic activities;Therapeutic exercise;DME instruction;Stair training;Functional mobility training    PT Goals (Current goals can be found in the Care Plan section)  Acute Rehab PT Goals Patient Stated Goal: go home PT Goal Formulation: With patient/family Time For Goal Achievement: 04/12/18 Potential to Achieve Goals: Good    Frequency 7X/week   Barriers to discharge        Co-evaluation               AM-PAC PT "6 Clicks" Mobility  Outcome Measure Help needed turning from your back to your side while in a flat bed without using bedrails?: A Little Help needed moving from lying on your back to sitting on the side of a flat bed without using bedrails?: A Little Help needed moving to and from a bed to a chair (including a wheelchair)?: A Little Help needed standing up from a chair using your arms (e.g., wheelchair or bedside chair)?: A Little Help needed to walk in hospital room?: A Little Help needed climbing 3-5 steps with a railing? : A Lot 6 Click Score: 17    End of Session Equipment Utilized During Treatment: Gait belt Activity Tolerance: Patient tolerated treatment well;Patient limited by fatigue Patient left: with chair alarm set;with call bell/phone within reach;with family/visitor present Nurse Communication: Mobility  status PT Visit Diagnosis: Muscle weakness (generalized) (M62.81);Difficulty in walking, not elsewhere classified (R26.2)    Time: 5009-3818 PT Time Calculation (min) (ACUTE ONLY): 38 min   Charges:   PT Evaluation $PT Eval Low Complexity: 1 Low PT Treatments $Gait Training: 8-22 mins        Kreg Shropshire, DPT 04/05/2018, 11:48 AM

## 2018-04-05 NOTE — Discharge Summary (Signed)
Physician Discharge Summary  Patient ID: Nancy Perkins MRN: [id number] DOB/AGE: 73-12-1945 73 y.o.  Admit date: 04/04/2018 Discharge date: 04/05/2018  Admission Diagnoses:  Hip dislocation, right ([place]) [S73.004A] Closed dislocation of hip, right, initial encounter [facility]) [place]   Discharge Diagnoses: Patient Active Problem List   Diagnosis Date Noted  . 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 Small vessel disease, cerebrovascular 11/30/2017  . Dementia, neurological ([place]) 08/31/2017  . Sensory neuropathy 08/31/2017  . Repeated falls 07/26/2017  . History of closed hip dislocation 05/31/2017  . Personal history of tobacco use, presenting hazards to health 02/05/2015  . Chronic kidney disease (CKD), stage III (moderate) ([place]) 12/09/2012  . Mixed incontinence 10/10/2012  . Hernia, rectovaginal 10/10/2012  . Essential (primary) hypertension 10/09/2010  . 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 Current tobacco use 10/09/2010    Past Medical History:  Diagnosis Date  . Acid reflux   . Cancer ([place])    skin ca  . Chronic kidney disease (CKD), stage III (moderate) ([place]) 12/09/2012  . 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 Current tobacco use 10/09/2010  . Essential (primary) hypertension 10/09/2010  . Hernia, rectovaginal 10/10/2012  . Mixed incontinence 10/10/2012  . Personal history of tobacco use, presenting hazards to health 02/05/2015  . Vascular dementia [facility])      Transfusion: No transfusions during this admission   Consultants (if any):   Discharged Condition: Improved  Hospital Course: Nancy Perkins is an 72 y.o. female who was admitted 04/04/2018 with a diagnosis of dislocation right total hip arthroplasty and went to the operating room on 04/05/2018 and underwent the above named procedures.    Surgeries:Procedure(s): CLOSED REDUCTION HIP on 04/05/2018  PRE-OPERATIVE  DIAGNOSIS: Posterior dislocation of the right total hip arthroplasty  POST-OPERATIVE DIAGNOSIS:  Same  PROCEDURE: Closed reduction of the right total hip arthroplasty dislocation under anesthesia  SURGEON:  Marciano Sequin., M.D.          ASSISTANT: None  ANESTHESIA: general  ESTIMATED BLOOD LOSS: None  FLUIDS REPLACED: 200 mL of crystalloid  INDICATIONS FOR SURGERY: Nancy Perkins is a 72 y.o. year old female who has a previous history of right total hip arthroplasty and subsequent revision.  She sustained a dislocation of the right hip in January 2019 after a fall.  She had done well until this evening.  While attempting to get out of the bed she felt the right hip "pop out" and had the immediate onset of pain.  X-rays demonstrated a dislocation of the right total hip arthroplasty.  Attempted reduction by the Emergency Department physician was unsuccessful.  After discussion of the risks and benefits of surgical intervention, the patient expressed understanding of the risks benefits and agree with plans for closed reduction of the right hip dislocation under anesthesia.   PROCEDURE IN DETAIL: The patient was brought into the operating room and, after adequate general anesthesia was achieved, a timeout was performed as per usual protocol.  The right hip was flexed, adducted, and internally rotated.  Traction was then applied and the hip was gently externally rotated until there was a palpable clunk.  The hip was then placed through a gentle range of motion.  Concentric reduction of the femoral head and the acetabular component was confirmed using C arm. Patient tolerated the surgery well. No complications .Patient was taken to PACU where she was stabilized and then transferred to the orthopedic floor.  Patient was  fitted with a knee immobilizer.  Patient allowed to weight-bear as tolerated.  Physical therapy was initiated day 1 following reduction  She benefited maximally  from the hospital stay and there were no complications.    Recent vital signs:  Vitals:   04/05/18 0429 04/05/18 0723  BP: (!) 160/86 (!) 151/87  Pulse: 92 87  Resp: 17 18  Temp: 98.5 F (36.9 C) 98.1 F (36.7 C)  SpO2: 99% 97%    Recent laboratory studies:  Lab Results  Component Value Date   HGB 13.0 04/05/2018   HGB 14.5 05/31/2017   HGB 11.9 (L) 10/26/2012   Lab Results  Component Value Date   WBC 8.9 04/05/2018   PLT 221 04/05/2018   Lab Results  Component Value Date   INR 1.03 04/05/2018   Lab Results  Component Value Date   NA 140 04/05/2018   K 4.4 04/05/2018   CL 108 04/05/2018   CO2 23 04/05/2018   BUN 41 (H) 04/05/2018   CREATININE 1.88 (H) 04/05/2018   GLUCOSE 108 (H) 04/05/2018    Discharge Medications:   Allergies as of 04/05/2018      Reactions   Beta Adrenergic Blockers Other (See Comments)   Heart block   Hydrochlorothiazide Other (See Comments)   Caused a heartblock   Sulfathiazole    Other reaction(s): RASH   Thiazide-type Diuretics    Heart block caused by beta blockers in general      Medication List    TAKE these medications   albuterol 108 (90 Base) MCG/ACT inhaler Commonly known as:  PROVENTIL HFA;VENTOLIN HFA continuous as needed.   aspirin 81 MG tablet Take 81 mg by mouth.   buPROPion 300 MG 24 hr tablet Commonly known as:  WELLBUTRIN XL Take 1 tablet by mouth daily.   cephALEXin 250 MG capsule Commonly known as:  KEFLEX Take 1 capsule by mouth daily.   ciprofloxacin 250 MG tablet Commonly known as:  CIPRO Take 1 tablet (250 mg total) by mouth every 12 (twelve) hours.   DULoxetine 60 MG capsule Commonly known as:  CYMBALTA Take 1 capsule by mouth daily.   HYDROcodone-acetaminophen 5-325 MG tablet Commonly known as:  NORCO/VICODIN Take 1 tablet by mouth every 4 (four) hours as needed.   KLOR-CON 10 10 MEQ tablet Generic drug:  potassium chloride Take 10 mEq by mouth daily.   Magnesium 250 MG Tabs Take  250 mg by mouth daily.   nystatin cream Commonly known as:  MYCOSTATIN continuous as needed.   omeprazole 40 MG capsule Commonly known as:  PRILOSEC Take 40 mg by mouth daily.   simvastatin 20 MG tablet Commonly known as:  ZOCOR Take 20 mg by mouth at bedtime.   STOOL SOFTENER 100 MG capsule Generic drug:  docusate sodium Take 100 mg by mouth.   Vitamin D (Ergocalciferol) 1.25 MG (50000 UT) Caps capsule Commonly known as:  DRISDOL Take 50,000 Units by mouth every 7 (seven) days.            Discharge Care Instructions  (From admission, onward)         Start     Ordered   04/05/18 0000  Weight bearing as tolerated    Question:  Laterality  Answer:  bilateral   04/05/18 0349          Diagnostic Studies: Dg C-arm 1-60 Min  Result Date: 04/05/2018 CLINICAL DATA:  Closed reduction right hip. EXAM: DG C-ARM 61-120 MIN; OPERATIVE RIGHT HIP WITH PELVIS COMPARISON:  Radiographs earlier this day. FINDINGS: Three fluoroscopic spot images obtained in the operating room demonstrate reduction of prior arthroplasty dislocation. The femoral head component is no seated in the acetabular component. Total fluoroscopy time 3 seconds. Dose 0.48 mGy. IMPRESSION: Successful reduction of prior right hip arthroplasty dislocation. Electronically Signed   By: Keith Rake M.D.   On: 04/05/2018 04:07   Dg Hip Port Unilat W Or Wo Pelvis 1 View Right  Result Date: 04/05/2018 CLINICAL DATA:  Arthroplasty dislocation, postreduction. EXAM: DG HIP (WITH OR WITHOUT PELVIS) 1V PORT RIGHT COMPARISON:  Radiographs earlier this day. FINDINGS: Femoral head component of right hip arthroplasty remains dislocated, however now appears anterior to the acetabular cup. No visualized fracture. IMPRESSION: Unsuccessful postreduction with femoral head component of right hip arthroplasty dislocated from the acetabular cup, however femoral head now appears anteriorly/medially dislocated (previously superiorly).  Electronically Signed   By: Keith Rake M.D.   On: 04/05/2018 01:19   Dg Hip Operative Unilat W Or W/o Pelvis Right  Result Date: 04/05/2018 CLINICAL DATA:  Closed reduction right hip. EXAM: DG C-ARM 61-120 MIN; OPERATIVE RIGHT HIP WITH PELVIS COMPARISON:  Radiographs earlier this day. FINDINGS: Three fluoroscopic spot images obtained in the operating room demonstrate reduction of prior arthroplasty dislocation. The femoral head component is no seated in the acetabular component. Total fluoroscopy time 3 seconds. Dose 0.48 mGy. IMPRESSION: Successful reduction of prior right hip arthroplasty dislocation. Electronically Signed   By: Keith Rake M.D.   On: 04/05/2018 04:07   Dg Hip Unilat W Or Wo Pelvis 2-3 Views Right  Result Date: 04/04/2018 CLINICAL DATA:  Fall out of bed. Right hip pain. Initial encounter. EXAM: DG HIP (WITH OR WITHOUT PELVIS) 2-3V RIGHT COMPARISON:  05/31/2017 FINDINGS: Superior dislocation of bipolar right hip prosthesis is again seen. No evidence of acute fracture involving the proximal femur or pelvis. Generalized osteopenia and peripheral vascular calcification again noted. IMPRESSION: Superior dislocation of right hip prosthesis. No evidence of acute fracture. Electronically Signed   By: Earle Gell M.D.   On: 04/04/2018 23:18    Disposition: Discharge disposition: 01-Home or Self Care       Discharge Instructions    Call MD / Call 911   Complete by:  As directed    If you experience chest pain or shortness of breath, CALL 911 and be transported to the hospital emergency room.  If you develope a fever above 101 F, pus (white drainage) or increased drainage or redness at the wound, or calf pain, call your surgeon's office.   Constipation Prevention   Complete by:  As directed    Drink plenty of fluids.  Prune juice may be helpful.  You may use a stool softener, such as Colace (over the counter) 100 mg twice a day.  Use MiraLax (over the counter) for  constipation as needed.   Diet general   Complete by:  As directed    Do not sit on low chairs, stoools or toilet seats, as it may be difficult to get up from low surfaces   Complete by:  As directed    Increase activity slowly   Complete by:  As directed    Increase activity slowly as tolerated   Complete by:  As directed    Weight bearing as tolerated   Complete by:  As directed    Laterality:  bilateral         Signed: Watt Climes 04/05/2018, 7:25 AM

## 2018-04-05 NOTE — Consult Note (Signed)
ORTHOPAEDIC CONSULTATION  PATIENT NAME: Nancy Perkins DOB: [birth year]  MRN: [id number]  REQUESTING PHYSICIAN: Hinda Kehr, MD  Chief Complaint: right hip pain  HPI: Nancy Perkins is a 72 y.o. female who complains of right hip pain. She has a previous history of right total hip arthroplasty and subsequent revision. In January she sustained a fall and dislocated the right hip, required closed reduction under anesthesia. Tonight she was attempting to get in the bed and felt the hip "pop out". There was no fall. She has been ambulating with a rollator since January.   Attempted closed reduction in the ER was not successful.  Past Medical History:  Diagnosis Date  . Acid reflux   . Cancer ([place])    skin ca  . Chronic kidney disease (CKD), stage III (moderate) ([place]) 12/09/2012  . 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 Current tobacco use 10/09/2010  . Essential (primary) hypertension 10/09/2010  . Hernia, rectovaginal 10/10/2012  . Mixed incontinence 10/10/2012  . Personal history of tobacco use, presenting hazards to health 02/05/2015  . Vascular dementia [facility])    Past Surgical History:  Procedure Laterality Date  . ABDOMINAL HYSTERECTOMY    . HIP CLOSED REDUCTION Right 05/31/2017   Procedure: CLOSED REDUCTION HIP;  Surgeon: Leim Fabry, MD;  Location: [facility] ORS;  Service: Orthopedics;  Laterality: Right;  . HIP SURGERY    . REPLACEMENT TOTAL KNEE BILATERAL     Social History   Socioeconomic History  . Marital status: Divorced    Spouse name: Not on file  . Number of children: Not on file  . Years of education: Not on file  . Highest education level: Not on file  Occupational History  . Not on file  Social Needs  . Financial resource strain: Not on file  . Food insecurity:    Worry: Not on file    Inability: Not on file  . Transportation needs:    Medical: Not on file    Non-medical: Not on file  Tobacco Use  . Smoking status: Former Smoker     Packs/day: 1.00    Years: 31.00    Pack years: 31.00    Types: Cigarettes    Last attempt to quit: 04/04/2017    Years since quitting: 1.0  . Smokeless tobacco: Never Used  Substance and Sexual Activity  . Alcohol use: Never    Frequency: Never  . Drug use: Never  . Sexual activity: Not on file  Lifestyle  . Physical activity:    Days per week: Not on file    Minutes per session: Not on file  . Stress: Not on file  Relationships  . Social connections:    Talks on phone: Not on file    Gets together: Not on file    Attends religious service: Not on file    Active member of club or organization: Not on file    Attends meetings of clubs or organizations: Not on file    Relationship status: Not on file  Other Topics Concern  . Not on file  Social History Narrative  . Not on file   Family History  Problem Relation Age of Onset  . Breast cancer Sister 52  . Breast cancer Maternal Aunt   . Breast cancer Cousin 75       paternal  . Prostate cancer Maternal Grandfather   . Alzheimer's disease Mother   . Diabetes Mother   . COPD Father   . Diabetes  Father   . Bladder Cancer Neg Hx   . Kidney cancer Neg Hx    Allergies  Allergen Reactions  . Beta Adrenergic Blockers Other (See Comments)    Heart block  . Hydrochlorothiazide Other (See Comments)    Caused a heartblock  . Sulfathiazole     Other reaction(s): RASH  . Thiazide-Type Diuretics     Heart block caused by beta blockers in general   Prior to Admission medications   Medication Sig Start Date End Date Taking? Authorizing Provider  albuterol (PROVENTIL HFA;VENTOLIN HFA) 108 (90 Base) MCG/ACT inhaler continuous as needed. 09/06/15   [provider]  aspirin 81 MG tablet Take 81 mg by mouth. 10/08/10   [provider]  buPROPion (WELLBUTRIN XL) 300 MG 24 hr tablet Take 1 tablet by mouth daily. 05/28/17   [provider]  cephALEXin (KEFLEX) 250 MG capsule Take 1 capsule by mouth daily.  05/28/17   [provider]  ciprofloxacin (CIPRO) 250 MG tablet Take 1 tablet (250 mg total) by mouth every 12 (twelve) hours. 03/08/18   Coral Spikes, DO  docusate sodium (STOOL SOFTENER) 100 MG capsule Take 100 mg by mouth.    [provider]  DULoxetine (CYMBALTA) 60 MG capsule Take 1 capsule by mouth daily. 05/12/17   [provider]  HYDROcodone-acetaminophen (NORCO/VICODIN) 5-325 MG tablet Take 1 tablet by mouth every 4 (four) hours as needed. 06/01/17   Reche Dixon, PA-C  Magnesium 250 MG TABS Take 250 mg by mouth daily.  10/08/10   [provider]  nystatin cream (MYCOSTATIN) continuous as needed. 07/09/14   [provider]  omeprazole (PRILOSEC) 40 MG capsule Take 40 mg by mouth daily.  09/08/12   [provider]  potassium chloride (KLOR-CON 10) 10 MEQ tablet Take 10 mEq by mouth daily.  10/08/10   [provider]  simvastatin (ZOCOR) 20 MG tablet Take 20 mg by mouth at bedtime.  04/17/15   [provider]  Vitamin D, Ergocalciferol, (DRISDOL) 50000 units CAPS capsule Take 50,000 Units by mouth every 7 (seven) days.     [provider]   Dg Hip [place] W Or [place] Pelvis 1 View Right  Result Date: 04/05/2018 CLINICAL DATA:  Arthroplasty dislocation, postreduction. EXAM: DG HIP (WITH OR WITHOUT PELVIS) 1V PORT RIGHT COMPARISON:  Radiographs earlier this day. FINDINGS: Femoral head component of right hip arthroplasty remains dislocated, however now appears anterior to the acetabular cup. No visualized fracture. IMPRESSION: Unsuccessful postreduction with femoral head component of right hip arthroplasty dislocated from the acetabular cup, however femoral head now appears anteriorly/medially dislocated (previously superiorly). Electronically Signed   By: Keith Rake M.D.   On: 04/05/2018 01:19   Dg Hip Unilat W Or Wo Pelvis 2-3 Views Right  Result Date: 04/04/2018 CLINICAL DATA:  Fall out of bed. Right hip pain.  Initial encounter. EXAM: DG HIP (WITH OR WITHOUT PELVIS) 2-3V RIGHT COMPARISON:  05/31/2017 FINDINGS: Superior dislocation of bipolar right hip prosthesis is again seen. No evidence of acute fracture involving the proximal femur or pelvis. Generalized osteopenia and peripheral vascular calcification again noted. IMPRESSION: Superior dislocation of right hip prosthesis. No evidence of acute fracture. Electronically Signed   By: Earle Gell M.D.   On: 04/04/2018 23:18    Positive ROS: All other systems have been reviewed and were otherwise negative with the exception of those mentioned in the HPI and as above.  Physical Exam: General: Well developed, well nourished female seen in  no acute distress. HEENT: Atraumatic and normocephalic. Sclera are clear. Extraocular motion is intact. Oropharynx is clear with moist mucosa. Neck: Supple, nontender, good range of motion. No JVD or carotid bruits. Lungs: Clear to auscultation bilaterally. Cardiovascular: Regular rate and rhythm with normal S1 and S2. 2-3/6  murmur. No gallops or rubs. Pedal pulses are palpable bilaterally. Homans test is negative bilaterally. No significant pretibial or ankle edema. Abdomen: Soft, nontender, and nondistended. Bowel sounds are present. Skin: No lesions in the area of chief complaint Neurologic: Awake, alert, and oriented. Sensory function is grossly intact. Motor strength is felt to be 5 over 5 bilaterally. No clonus or tremor. Good motor coordination. Lymphatic: No axillary or cervical lymphadenopathy  MUSCULOSKELETAL: Pain with attempted range of motion of the right hip. The right lower extremity is slightly externally rotated and shortened. Knee immobilizer is in place.  Assessment: Dislocated right total hip arthroplasty   Plan: The findings were discussed in detail with the patient and daughter. Recommendation was made for closed reduction under anesthesia. The usual perioperative course was discussed. The risks  and benefits of surgical intervention were reviewed. The patient expressed understanding of the risks and benefits and agreed with plans for surgical intervention.   The surgical site was signed as per the "right site surgery" protocol.   James P. Holley Bouche M.D.

## 2018-04-05 NOTE — Care Management Note (Signed)
Case Management Note  Patient Details  Name: Nancy Perkins MRN: [id number] Date of Birth: 1945/12/01  Subjective/Objective:                  RNCM met with patient and her daughter to discuss discharge to home today. She has a walker available for use. She states [facility] has used [facility] and requests [facility].  PCP is Angelina Ok.  Action/Plan:  Home health list provided. Patient already knew what agency she wanted.   Referral to [facility] with [facility]. No other RNCM needs. Home PT orders requested from Dr. Marry Guan.    Expected Discharge Date:  04/05/18               Expected Discharge Plan:     In-House Referral:     Discharge planning Services  CM Consult  Post Acute Care Choice:  Home Health Choice offered to:  Patient, Adult Children  DME Arranged:    DME Agency:     HH Arranged:  PT [place]:  [place]  Status of Service:  Completed, signed off  If discussed at [place] of Stay Meetings, dates discussed:    Additional Comments:  Marshell Garfinkel, RN 04/05/2018, 10:35 AM

## 2018-04-05 NOTE — Sedation Documentation (Signed)
Post closed procedure complete by Karma Greaser, MD. Pt placed in knee immobilizer by Donato Schultz, EDT and Karma Greaser, MD.

## 2018-04-05 NOTE — Anesthesia Preprocedure Evaluation (Signed)
Anesthesia Evaluation  Patient identified by MRN, date of birth, ID band Patient awake    Reviewed: Allergy & Precautions, H&P , NPO status , Patient's Chart, lab work & pertinent test results, reviewed documented beta blocker date and time   Airway Mallampati: II   Neck ROM: full    Dental  (+) Poor Dentition   Pulmonary neg pulmonary ROS, COPD, former smoker,    Pulmonary exam normal        Cardiovascular Exercise Tolerance: Poor hypertension, On Medications negative cardio ROS Normal cardiovascular exam Rhythm:regular Rate:Normal     Neuro/Psych PSYCHIATRIC DISORDERS Dementia negative neurological ROS  negative psych ROS   GI/Hepatic negative GI ROS, Neg liver ROS, GERD  Medicated,  Endo/Other  negative endocrine ROS  Renal/GU Renal diseasenegative Renal ROS  negative genitourinary   Musculoskeletal   Abdominal   Peds  Hematology negative hematology ROS (+)   Anesthesia Other Findings Past Medical History: No date: Acid reflux No date: Cancer ([place])     Comment:  skin ca 12/09/2012: Chronic kidney disease (CKD), stage III (moderate) ([facility]) 10/09/2010: Combined fat and carbohydrate induced hyperlipemia 10/09/2010: Current tobacco use 10/09/2010: Essential (primary) hypertension 10/10/2012: Hernia, rectovaginal 10/10/2012: Mixed incontinence 02/05/2015: Personal history of tobacco use, presenting hazards to  health No date: Vascular dementia ([place]) Past Surgical History: No date: ABDOMINAL HYSTERECTOMY 05/31/2017: HIP CLOSED REDUCTION; Right     Comment:  Procedure: CLOSED REDUCTION HIP;  Surgeon: Leim Fabry,              MD;  Location: [facility] ORS;  Service: Orthopedics;                Laterality: Right; No date: HIP SURGERY No date: REPLACEMENT TOTAL KNEE BILATERAL BMI    Body Mass Index:  34.11 kg/m     Reproductive/Obstetrics negative OB ROS                             Anesthesia  Physical Anesthesia Plan  ASA: III and emergent  Anesthesia Plan: General   Post-op Pain Management:    Induction:   PONV Risk Score and Plan:   Airway Management Planned:   Additional Equipment:   Intra-op Plan:   Post-operative Plan:   Informed Consent: I have reviewed the patients History and Physical, chart, labs and discussed the procedure including the risks, benefits and alternatives for the proposed anesthesia with the patient or authorized representative who has indicated his/her understanding and acceptance.   Dental Advisory Given  Plan Discussed with: CRNA  Anesthesia Plan Comments:         Anesthesia Quick Evaluation

## 2018-04-05 NOTE — Transfer of Care (Signed)
Immediate Anesthesia Transfer of Care Note  Patient: Nancy Perkins  Procedure(s) Performed: CLOSED REDUCTION HIP (Right Hip)  Patient Location: PACU  Anesthesia Type:General  Level of Consciousness: awake, alert , oriented and patient cooperative  Airway & Oxygen Therapy: Patient Spontanous Breathing and Patient connected to nasal cannula oxygen  Post-op Assessment: Report given to RN and Post -op Vital signs reviewed and stable  Post vital signs: Reviewed and stable  Last Vitals:  Vitals Value Taken Time  BP 141/75 04/05/2018  3:15 AM  Temp 36.5 C 04/05/2018  3:15 AM  Pulse 108 04/05/2018  3:23 AM  Resp 17 04/05/2018  3:23 AM  SpO2 98 % 04/05/2018  3:23 AM  Vitals shown include unvalidated device data.  Last Pain:  Vitals:   04/05/18 0315  PainSc: 0-No pain         Complications: No apparent anesthesia complications

## 2018-04-05 NOTE — Care Management Obs Status (Signed)
[place] NOTIFICATION   Patient Details  Name: Nancy Perkins MRN: [id number] Date of Birth: 06-Sep-1945   Medicare Observation Status Notification Given:  No  <24 hours  Marshell Garfinkel, RN 04/05/2018, 8:57 AM

## 2018-04-05 NOTE — Op Note (Signed)
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   ASSISTANT: None  ANESTHESIA: general  ESTIMATED BLOOD LOSS: None  FLUIDS REPLACED: 200 mL of crystalloid  INDICATIONS FOR SURGERY: Nancy Perkins is a 72 y.o. year old female who has a previous history of right total hip arthroplasty and subsequent revision.  She sustained a dislocation of the right hip in January 2019 after a fall.  She had done well until this evening.  While attempting to get out of the bed she felt the right hip "pop out" and had the immediate onset of pain.  X-rays demonstrated a dislocation of the right total hip arthroplasty.  Attempted reduction by the Emergency Department physician was unsuccessful.  After discussion of the risks and benefits of surgical intervention, the patient expressed understanding of the risks benefits and agree with plans for closed reduction of the right hip dislocation under anesthesia.   PROCEDURE IN DETAIL: The patient was brought into the operating room and, after adequate general anesthesia was achieved, a timeout was performed as per usual protocol.  The right hip was flexed, adducted, and internally rotated.  Traction was then applied and the hip was gently externally rotated until there was a palpable clunk.  The hip was then placed through a gentle range of motion.  Concentric reduction of the femoral head and the acetabular component was confirmed using C arm.  The patient tolerated the procedure well.  She was transported to the recovery room in stable condition.  James P. Holley Bouche M.D.

## 2018-04-05 NOTE — Progress Notes (Signed)
   Subjective: Day of Surgery Procedure(s) (LRB): CLOSED REDUCTION HIP (Right) Patient reports pain as mild right groin pain Patient is well, and has had no acute complaints or problems We will start therapy today.  Plan is to go Home after hospital stay. no nausea and no vomiting Patient denies any chest pains or shortness of breath. Objective: Vital signs in last 24 hours: Temp:  [97.1 F (36.2 C)-98.5 F (36.9 C)] 98.5 F (36.9 C) (11/26 0429) Pulse Rate:  [92-105] 92 (11/26 0429) Resp:  [13-22] 17 (11/26 0429) BP: (137-163)/(74-95) 160/86 (11/26 0429) SpO2:  [93 %-99 %] 99 % (11/26 0429) Weight:  [93 kg] 93 kg (11/25 2229) Knee immobilizer in place  Intake/Output from previous day: 11/25 0701 - 11/26 0700 In: 300 [I.V.:300] Out: 100 [Urine:100] Intake/Output this shift: No intake/output data recorded.  Recent Labs    04/05/18 0114  HGB 13.0   Recent Labs    04/05/18 0114  WBC 8.9  RBC 4.19  HCT 40.2  PLT 221   Recent Labs    04/05/18 0114  NA 140  K 4.4  CL 108  CO2 23  BUN 41*  CREATININE 1.88*  GLUCOSE 108*  CALCIUM 10.0   Recent Labs    04/05/18 0114  INR 1.03    EXAM General - Patient is Alert, Appropriate and Oriented Extremity - Neurologically intact Neurovascular intact Sensation intact distally Intact pulses distally Dorsiflexion/Plantar flexion intact Compartment soft Dressing -none Motor Function - intact, moving foot and toes well on exam.   Past Medical History:  Diagnosis Date  . Acid reflux   . Cancer ([place])    skin ca  . Chronic kidney disease (CKD), stage III (moderate) ([place]) 12/09/2012  . 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 Current tobacco use 10/09/2010  . Essential (primary) hypertension 10/09/2010  . Hernia, rectovaginal 10/10/2012  . Mixed incontinence 10/10/2012  . Personal history of tobacco use, presenting hazards to health 02/05/2015  . Vascular dementia ([place])      Assessment/Plan: Day of Surgery Procedure(s) (LRB): CLOSED REDUCTION HIP (Right) Active Problems:   S/P closed reduction of dislocated total hip prosthesis  Estimated body mass index is 34.11 kg/m as calculated from the following:   Height as of this encounter: 5\' 5"  (1.651 m).   Weight as of this encounter: 93 kg. Advance diet Up with therapy D/C IV fluids Discharge home with home health  Labs: None DVT Prophylaxis - None Weight-Bearing as tolerated to right leg D/C O2 and Pulse OX and try on Room Air May be discharged home when she has physical therapy. Will need to follow-up with her orthopedist at [facility] orthopedic in 3 weeks Knee immobilizer is to be worn 24 hours a day until seen by the orthopedist Tylenol for pain  Wille Glaser R. Winona Ashley 04/05/2018, 7:20 AM

## 2018-04-12 ENCOUNTER — Telehealth: Payer: Self-pay | Admitting: *Deleted

## 2018-04-12 NOTE — Telephone Encounter (Signed)
Patient called to report she is still having trouble finding transportation for lung screening scan. Patient given information that we may be able to provide transportation on our [place]. Patient understands risk of waiting to do lung screening scan. And will call me back to schedule.

## 2018-04-13 ENCOUNTER — Telehealth: Payer: Self-pay

## 2018-04-13 NOTE — Care Management (Addendum)
Post discharge: patient discharge last Tuesday and called RNCM stating that PT has not been out. I have left message for Corene Cornea with [facility] to follow up. Mia Creek PA paged. There was not home health order in place at discharge. I have sent message to [facility] ortho team with request to send home PT orders to [facility].

## 2018-04-19 NOTE — Anesthesia Postprocedure Evaluation (Signed)
Anesthesia Post Note  Patient: Nancy Perkins  Procedure(s) Performed: CLOSED REDUCTION HIP (Right Hip)  Patient location during evaluation: PACU Anesthesia Type: General Level of consciousness: awake and alert Pain management: pain level controlled Vital Signs Assessment: post-procedure vital signs reviewed and stable Respiratory status: spontaneous breathing, nonlabored ventilation, respiratory function stable and patient connected to nasal cannula oxygen Cardiovascular status: blood pressure returned to baseline and stable Postop Assessment: no apparent nausea or vomiting Anesthetic complications: no     Last Vitals:  Vitals:   04/05/18 0429 04/05/18 0723  BP: (!) 160/86 (!) 151/87  Pulse: 92 87  Resp: 17 18  Temp: 36.9 C 36.7 C  SpO2: 99% 97%    Last Pain:  Vitals:   04/05/18 0920  TempSrc:   PainSc: 3                  Molli Barrows

## 2018-04-27 ENCOUNTER — Telehealth: Payer: Self-pay | Admitting: *Deleted

## 2018-04-27 NOTE — Telephone Encounter (Signed)
Attempted to contact patient r/t LDCT Screening follow up due at this time.  No answer received, message left for patient to call [phone] to schedule appointment.    

## 2018-05-16 ENCOUNTER — Ambulatory Visit
Admission: EM | Admit: 2018-05-16 | Discharge: 2018-05-16 | Disposition: A | Discharge status: Home / Self Care | Payer: Medicare Other | Attending: Family Medicine | Admitting: Family Medicine

## 2018-05-16 ENCOUNTER — Other Ambulatory Visit: Payer: Self-pay

## 2018-05-16 ENCOUNTER — Encounter: Payer: Self-pay | Admitting: Emergency Medicine

## 2018-05-16 DIAGNOSIS — N390 Urinary tract infection, site not specified: Secondary | ICD-10-CM | POA: Diagnosis not present

## 2018-05-16 DIAGNOSIS — Z87440 Personal history of urinary (tract) infections: Secondary | ICD-10-CM | POA: Diagnosis not present

## 2018-05-16 LAB — URINALYSIS, COMPLETE (UACMP) WITH MICROSCOPIC
Glucose, UA: NEGATIVE mg/dL
KETONES UR: NEGATIVE mg/dL
Nitrite: POSITIVE — AB
PH: 6 (ref 5.0–8.0)
PROTEIN: NEGATIVE mg/dL
Specific Gravity, Urine: 1.015 (ref 1.005–1.030)
WBC, UA: >50 WBC/hpf (ref 0–5)

## 2018-05-16 MED ORDER — CIPROFLOXACIN HCL 250 MG PO TABS: 250.0000 mg | ORAL_TABLET | Freq: Two times a day (BID) | ORAL | 0 refills | Status: AC

## 2018-05-16 NOTE — ED Provider Notes (Signed)
[facility]    CSN: [id number] Arrival date & time: 05/16/18  1801     History   Chief Complaint Chief Complaint  Patient presents with  . Dysuria    HPI Nancy Perkins is a 73 y.o. female.   Patient is a 73 year old female who presents with her daughter with complaint of UTI.  Patient reports she has frequent UTIs with the last one being a few months ago.  Patient reports burning, low back pain, and a foul odor to her urine.  Patient states her symptoms started this morning.  Her daughter reports she has had a GI bug recently and has had some incontinence.  Patient denies any fever, chest pain, shortness of breath, abdominal pain.  She does report some tenderness to the bladder area.  Patient is on Keflex 1050 mg nightly prophylactically for her chronic UTIs.  Patient is not taking thing at home for this as of yet.     Past Medical History:  Diagnosis Date  . Acid reflux   . Cancer ([place])    skin ca  . Chronic kidney disease (CKD), stage III (moderate) ([place]) 12/09/2012  . 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 Current tobacco use 10/09/2010  . Essential (primary) hypertension 10/09/2010  . Hernia, rectovaginal 10/10/2012  . Mixed incontinence 10/10/2012  . Personal history of tobacco use, presenting hazards to health 02/05/2015  . Vascular dementia [facility])     Patient Active Problem List   Diagnosis Date Noted  . 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 Small vessel disease, cerebrovascular 11/30/2017  . Dementia, neurological ([place]) 08/31/2017  . Sensory neuropathy 08/31/2017  . Repeated falls 07/26/2017  . History of closed hip dislocation 05/31/2017  . Personal history of tobacco use, presenting hazards to health 02/05/2015  . Chronic kidney disease (CKD), stage III (moderate) ([place]) 12/09/2012  . Mixed incontinence 10/10/2012  . Hernia, rectovaginal 10/10/2012  . Essential (primary) hypertension 10/09/2010   . 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 Current tobacco use 10/09/2010    Past Surgical History:  Procedure Laterality Date  . ABDOMINAL HYSTERECTOMY    . HIP CLOSED REDUCTION Right 05/31/2017   Procedure: CLOSED REDUCTION HIP;  Surgeon: Leim Fabry, MD;  Location: [facility] ORS;  Service: Orthopedics;  Laterality: Right;  . HIP CLOSED REDUCTION Right 04/05/2018   Procedure: CLOSED REDUCTION HIP;  Surgeon: Dereck Leep, MD;  Location: [facility] ORS;  Service: Orthopedics;  Laterality: Right;  . HIP SURGERY    . REPLACEMENT TOTAL KNEE BILATERAL      OB History   No obstetric history on file.      Home Medications    Prior to Admission medications   Medication Sig Start Date End Date Taking? Authorizing Provider  albuterol (PROVENTIL HFA;VENTOLIN HFA) 108 (90 Base) MCG/ACT inhaler continuous as needed. 09/06/15  Yes [provider]  aspirin 81 MG tablet Take 81 mg by mouth. 10/08/10  Yes [provider]  buPROPion (WELLBUTRIN XL) 300 MG 24 hr tablet Take 1 tablet by mouth daily. 05/28/17  Yes [provider]  cephALEXin (KEFLEX) 250 MG capsule Take 1 capsule by mouth daily. 05/28/17  Yes [provider]  DULoxetine (CYMBALTA) 60 MG capsule Take 1 capsule by mouth daily. 05/12/17  Yes [provider]  HYDROcodone-acetaminophen (NORCO/VICODIN) 5-325 MG tablet Take 1 tablet by mouth every 4 (four) hours as needed. 06/01/17  Yes Reche Dixon, PA-C  Magnesium 250 MG TABS  Take 250 mg by mouth daily.  10/08/10  Yes [provider]  nystatin cream (MYCOSTATIN) continuous as needed. 07/09/14  Yes [provider]  omeprazole (PRILOSEC) 40 MG capsule Take 40 mg by mouth daily.  09/08/12  Yes [provider]  potassium chloride (KLOR-CON 10) 10 MEQ tablet Take 10 mEq by mouth daily.  10/08/10  Yes [provider]  simvastatin (ZOCOR) 20 MG tablet Take 20 mg by mouth at bedtime.  04/17/15  Yes [provider]  Vitamin D, Ergocalciferol, (DRISDOL) 50000 units CAPS capsule Take 50,000 Units by mouth every 7 (seven) days.    Yes [provider]  ciprofloxacin (CIPRO) 250 MG tablet Take 1 tablet (250 mg total) by mouth every 12 (twelve) hours for 7 days. 05/16/18 05/23/18  Luvenia Redden, PA-C  docusate sodium (STOOL SOFTENER) 100 MG capsule Take 100 mg by mouth.    [provider]    Family History Family History  Problem Relation Age of Onset  . Breast cancer Sister 23  . Breast cancer Maternal Aunt   . Breast cancer Cousin 73       paternal  . Prostate cancer Maternal Grandfather   . Alzheimer's disease Mother   . Diabetes Mother   . COPD Father   . Diabetes Father   . Bladder Cancer Neg Hx   . Kidney cancer Neg Hx     Social History Social History   Tobacco Use  . Smoking status: Former Smoker    Packs/day: 1.00    Years: 31.00    Pack years: 31.00    Types: Cigarettes    Last attempt to quit: 04/04/2017    Years since quitting: 1.1  . Smokeless tobacco: Never Used  Substance Use Topics  . Alcohol use: Never    Frequency: Never  . Drug use: Never     Allergies   Beta adrenergic blockers; Hydrochlorothiazide; Sulfathiazole; and Thiazide-type diuretics   Review of Systems Review of Systems as noted above in HPI.  Other systems reviewed and found to be negative.   Physical Exam Triage Vital Signs ED Triage Vitals  Enc Vitals Group     BP 05/16/18 1829 (!) 146/104     Pulse Rate 05/16/18 1829 (!) 102     Resp 05/16/18 1829 16     Temp 05/16/18 1829 97.6 F (36.4 C)     Temp Source 05/16/18 1829 Oral     SpO2 05/16/18 1829 98 %     Weight 05/16/18 1825 200 lb (90.7 kg)     Height 05/16/18 1825 5\' 5"  (1.651 m)     Head Circumference --      Peak Flow --      Pain Score 05/16/18 1825 4     Pain Loc --      Pain Edu? --      Excl. in [place]? --    No data found.  Updated Vital Signs BP (!) 146/104 (BP Location: Left Arm)   Pulse  (!) 102   Temp 97.6 F (36.4 C) (Oral)   Resp 16   Ht 5\' 5"  (1.651 m)   Wt 200 lb (90.7 kg)   SpO2 98%   BMI 33.28 kg/m   Physical Exam Constitutional:      Appearance: Normal appearance.  HENT:     Head: Normocephalic and atraumatic.  Eyes:     Extraocular Movements: Extraocular movements intact.     Pupils: Pupils are equal, round, and reactive to  light.  Cardiovascular:     Rate and Rhythm: Regular rhythm.     Pulses: Normal pulses.     Heart sounds: Normal heart sounds.  Pulmonary:     Effort: Pulmonary effort is normal.     Breath sounds: Normal breath sounds. No wheezing or rales.  Abdominal:     General: Abdomen is flat. Bowel sounds are normal.     Palpations: Abdomen is soft.     Tenderness: There is no abdominal tenderness. There is no right CVA tenderness, left CVA tenderness or guarding.  Skin:    General: Skin is warm and dry.     Capillary Refill: Capillary refill takes less than 2 seconds.  Neurological:     General: No focal deficit present.     Mental Status: She is alert and oriented to person, place, and time.  Psychiatric:        Mood and Affect: Mood normal.        Behavior: Behavior normal.      UC Treatments / Results  Labs (all labs ordered are listed, but only abnormal results are displayed) Labs Reviewed  URINALYSIS, COMPLETE (UACMP) WITH MICROSCOPIC - Abnormal; Notable for the following components:      Result Value   APPearance CLOUDY (*)    Hgb urine dipstick TRACE (*)    Bilirubin Urine SMALL (*)    Nitrite POSITIVE (*)    Leukocytes, UA LARGE (*)    Bacteria, UA MANY (*)    All other components within normal limits  URINE CULTURE    EKG None  Radiology No results found.  Procedures Procedures (including critical care time)  Medications Ordered in UC Medications - No data to display  Initial Impression / Assessment and Plan / UC Course  I have reviewed the triage vital signs and the nursing notes.  Pertinent labs &  imaging results that were available during my care of the patient were reviewed by me and considered in my medical decision making (see chart for details).     Patient has a history of recurrent UTIs.  Most notably she has had E. coli and Klebsiella organisms that are grown.  These have all had varied resistance patterns with not much commonality between them.  Of note, patient is on Keflex 250 mg nightly for prophylaxis.  This tends me to lean away from other cephalosporins at this point.  Also had multiple of them with resistance to penicillins as well.  We will go ahead and try with Macrobid at this point.  Will need to watch her culture to see if he needs to be changed based on sensitivities.  Final Clinical Impressions(s) / UC Diagnoses   Final diagnoses:  Lower urinary tract infectious disease     Discharge Instructions     -Cipro: 1 tablet twice a day for 7 days -Push fluids -Ibuprofen or Tylenol as needed for pain -will call with culture results.    ED Prescriptions    Medication Sig Dispense Auth. Provider   ciprofloxacin (CIPRO) 250 MG tablet Take 1 tablet (250 mg total) by mouth every 12 (twelve) hours for 7 days. 14 tablet Luvenia Redden, PA-C     Controlled Substance Prescriptions [place] Controlled Substance Registry consulted? Not Applicable   Luvenia Redden, PA-C 05/16/18 2109

## 2018-05-16 NOTE — ED Triage Notes (Signed)
Pt c/o dysuria, urinary retention, lower back pain. Started this morning. She brought a urine sample in today because she had a hard time getting a sample when she was here last time. She has supplies for clean catch and sterile container. She has chronic UTI's.

## 2018-05-16 NOTE — Discharge Instructions (Signed)
-  Cipro: 1 tablet twice a day for 7 days -Push fluids -Ibuprofen or Tylenol as needed for pain -will call with culture results.

## 2018-05-19 LAB — URINE CULTURE: Culture: >=100000 — AB

## 2018-05-22 ENCOUNTER — Telehealth (HOSPITAL_COMMUNITY): Payer: Self-pay | Admitting: Emergency Medicine

## 2018-05-22 NOTE — Telephone Encounter (Signed)
Urine culture was positive for CITROBACTER BRAAKII and was given  CIPROFLOXACIN at urgent care visit. Attempted to reach patient. No answer at this time.

## 2018-06-01 ENCOUNTER — Telehealth: Payer: Self-pay | Admitting: *Deleted

## 2018-06-01 ENCOUNTER — Encounter: Payer: Self-pay | Admitting: *Deleted

## 2018-06-01 NOTE — Telephone Encounter (Signed)
Entered in error

## 2018-06-10 NOTE — Telephone Encounter (Signed)
Closing encounter

## 2018-06-22 ENCOUNTER — Other Ambulatory Visit: Payer: Self-pay | Admitting: Nurse Practitioner

## 2018-06-22 ENCOUNTER — Telehealth: Payer: Self-pay | Admitting: *Deleted

## 2018-06-22 DIAGNOSIS — Z122 Encounter for screening for malignant neoplasm of respiratory organs: Secondary | ICD-10-CM

## 2018-06-22 DIAGNOSIS — Z87891 Personal history of nicotine dependence: Secondary | ICD-10-CM

## 2018-06-22 DIAGNOSIS — Z1231 Encounter for screening mammogram for malignant neoplasm of breast: Secondary | ICD-10-CM

## 2018-06-22 NOTE — Telephone Encounter (Signed)
Patient has been notified that annual lung cancer screening low dose CT scan is due currently or will be in near future. Confirmed that patient is within the age range of 55-77, and asymptomatic, (no signs or symptoms of lung cancer). Patient denies illness that would prevent curative treatment for lung cancer if found. Verified smoking history, (current, 32 pack year). The shared decision making visit was done 02/09/14. Patient is agreeable for CT scan being scheduled.

## 2018-06-29 ENCOUNTER — Ambulatory Visit
Admission: RE | Admit: 2018-06-29 | Discharge: 2018-06-29 | Disposition: A | Discharge status: Home / Self Care | Payer: Medicare Other | Source: Ambulatory Visit | Attending: Oncology | Admitting: Oncology

## 2018-06-29 ENCOUNTER — Ambulatory Visit
Admission: RE | Admit: 2018-06-29 | Discharge: 2018-06-29 | Disposition: A | Discharge status: Home / Self Care | Payer: Medicare Other | Source: Ambulatory Visit | Attending: Nurse Practitioner | Admitting: Nurse Practitioner

## 2018-06-29 DIAGNOSIS — Z87891 Personal history of nicotine dependence: Secondary | ICD-10-CM

## 2018-06-29 DIAGNOSIS — Z122 Encounter for screening for malignant neoplasm of respiratory organs: Secondary | ICD-10-CM | POA: Insufficient documentation

## 2018-06-29 DIAGNOSIS — Z1231 Encounter for screening mammogram for malignant neoplasm of breast: Secondary | ICD-10-CM | POA: Diagnosis present

## 2018-06-30 ENCOUNTER — Telehealth: Payer: Self-pay | Admitting: *Deleted

## 2018-06-30 ENCOUNTER — Encounter: Payer: Self-pay | Admitting: *Deleted

## 2018-06-30 NOTE — Telephone Encounter (Signed)
Attempted to contact patient to discuss LDCT lung cancer screening results.  Unable to reach patient at this time.  Left Message for them to return call to [phone] to either myself or Shawn Perkins RN to discuss the results.    

## 2018-06-30 NOTE — Telephone Encounter (Signed)
Notified patient of LDCT lung cancer screening program results with recommendation for 12 month follow up imaging.  Also notified of incidental findings noted below and is encouraged to discuss further questions with PCP who will receive a copy of this not and/or the CT reports.  Patient verbalized understanding.     IMPRESSION: 1. Lung-RADS 1, negative. Continue annual screening with low-dose chest CT without contrast in 12 months. 2. Coronary artery atherosclerosis. 3. Severe L1 compression deformity, new since the prior CT. 4.  Possible constipation.  Aortic Atherosclerosis (ICD10-I70.0) and Emphysema (ICD10-J43.9).

## 2018-07-05 ENCOUNTER — Encounter: Payer: Self-pay | Admitting: *Deleted

## 2018-07-08 ENCOUNTER — Encounter: Payer: Self-pay | Admitting: *Deleted

## 2019-06-05 ENCOUNTER — Other Ambulatory Visit: Payer: Self-pay

## 2019-06-05 ENCOUNTER — Ambulatory Visit
Admission: EM | Admit: 2019-06-05 | Discharge: 2019-06-05 | Disposition: A | Discharge status: Home / Self Care | Payer: Medicare Other | Attending: Family Medicine | Admitting: Family Medicine

## 2019-06-05 DIAGNOSIS — N390 Urinary tract infection, site not specified: Secondary | ICD-10-CM | POA: Diagnosis present

## 2019-06-05 DIAGNOSIS — R319 Hematuria, unspecified: Secondary | ICD-10-CM | POA: Diagnosis present

## 2019-06-05 LAB — URINALYSIS, COMPLETE (UACMP) WITH MICROSCOPIC
Bilirubin Urine: NEGATIVE
Glucose, UA: NEGATIVE mg/dL
Ketones, ur: NEGATIVE mg/dL
Nitrite: POSITIVE — AB
Protein, ur: NEGATIVE mg/dL
Specific Gravity, Urine: 1.02 (ref 1.005–1.030)
WBC, UA: >50 WBC/hpf (ref 0–5)
pH: 6 (ref 5.0–8.0)

## 2019-06-05 MED ORDER — CIPROFLOXACIN HCL 250 MG PO TABS: 250.0000 mg | ORAL_TABLET | Freq: Two times a day (BID) | ORAL | 0 refills | Status: AC

## 2019-06-05 NOTE — ED Triage Notes (Signed)
Pt with frequent UTIs and reports she thinks she has another one. Dysuria, frequency and difficulty urinating

## 2019-06-05 NOTE — Discharge Instructions (Addendum)
Take medication as prescribed. Rest. Drink plenty of fluids.  ° °Follow up with your primary care physician this week as needed. Return to Urgent care for new or worsening concerns.  ° °

## 2019-06-05 NOTE — ED Provider Notes (Signed)
[facility] ____________________________________________  Time seen: Approximately 5:50 PM  I have reviewed the triage vital signs and the nursing notes.   HISTORY  Chief Complaint Urinary Frequency  HPI Nancy Perkins is a 74 y.o. female presenting with daughter at bedside for evaluation of urinary frequency and urgency.  Does also report some discomfort with urination present for the last 2 days.  Daughter also reports she noticed some odor to her mother's urine.  Reports this is consistent with previous urinary tract infections.  Denies abdominal pain, back pain, fevers, vomiting, diarrhea.  Denies recent chest pain, shortness of breath or other sickness.  Has continued to overall eat and drink well.  No alleviating measures attempted prior to arrival.  Patient takes prophylactic Keflex nightly.  Renee Rival, NP : PCP   Past Medical History:  Diagnosis Date  . Acid reflux   . Cancer ([place])    skin ca  . Chronic kidney disease (CKD), stage III (moderate) 12/09/2012  . 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 Current tobacco use 10/09/2010  . Essential (primary) hypertension 10/09/2010  . Hernia, rectovaginal 10/10/2012  . Mixed incontinence 10/10/2012  . Personal history of tobacco use, presenting hazards to health 02/05/2015  . Vascular dementia [facility])     Patient Active Problem List   Diagnosis Date Noted  . 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 Small vessel disease, cerebrovascular 11/30/2017  . Dementia, neurological ([place]) 08/31/2017  . Sensory neuropathy 08/31/2017  . Repeated falls 07/26/2017  . History of closed hip dislocation 05/31/2017  . Personal history of tobacco use, presenting hazards to health 02/05/2015  . Chronic kidney disease (CKD), stage III (moderate) 12/09/2012  . Mixed incontinence 10/10/2012  . Hernia, rectovaginal 10/10/2012  . Essential (primary) hypertension 10/09/2010  .  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 Current tobacco use 10/09/2010    Past Surgical History:  Procedure Laterality Date  . ABDOMINAL HYSTERECTOMY    . HIP CLOSED REDUCTION Right 05/31/2017   Procedure: CLOSED REDUCTION HIP;  Surgeon: Leim Fabry, MD;  Location: [facility] ORS;  Service: Orthopedics;  Laterality: Right;  . HIP CLOSED REDUCTION Right 04/05/2018   Procedure: CLOSED REDUCTION HIP;  Surgeon: Dereck Leep, MD;  Location: [facility] ORS;  Service: Orthopedics;  Laterality: Right;  . HIP SURGERY    . REPLACEMENT TOTAL KNEE BILATERAL       No current facility-administered medications for this encounter.  Current Outpatient Medications:  .  albuterol (PROVENTIL HFA;VENTOLIN HFA) 108 (90 Base) MCG/ACT inhaler, continuous as needed., Disp: , Rfl:  .  aspirin 81 MG tablet, Take 81 mg by mouth., Disp: , Rfl:  .  buPROPion (WELLBUTRIN XL) 300 MG 24 hr tablet, Take 1 tablet by mouth daily., Disp: , Rfl:  .  cephALEXin (KEFLEX) 250 MG capsule, Take 1 capsule by mouth daily., Disp: , Rfl:  .  ciprofloxacin (CIPRO) 250 MG tablet, Take 1 tablet (250 mg total) by mouth every 12 (twelve) hours for 7 days., Disp: 14 tablet, Rfl: 0 .  docusate sodium (STOOL SOFTENER) 100 MG capsule, Take 100 mg by mouth., Disp: , Rfl:  .  DULoxetine (CYMBALTA) 60 MG capsule, Take 1 capsule by mouth daily., Disp: , Rfl:  .  HYDROcodone-acetaminophen (NORCO/VICODIN) 5-325 MG tablet, Take 1 tablet by mouth every 4 (four) hours as needed., Disp: 30 tablet, Rfl: 0 .  Magnesium 250 MG TABS, Take 250 mg by mouth daily. , Disp: ,  Rfl:  .  nystatin cream (MYCOSTATIN), continuous as needed., Disp: , Rfl:  .  omeprazole (PRILOSEC) 40 MG capsule, Take 40 mg by mouth daily. , Disp: , Rfl:  .  potassium chloride (KLOR-CON 10) 10 MEQ tablet, Take 10 mEq by mouth daily. , Disp: , Rfl:  .  simvastatin (ZOCOR) 20 MG tablet, Take 20 mg by mouth at bedtime. , Disp: , Rfl:  .  Vitamin D, Ergocalciferol,  (DRISDOL) 50000 units CAPS capsule, Take 50,000 Units by mouth every 7 (seven) days. , Disp: , Rfl:   Allergies Beta adrenergic blockers, Hydrochlorothiazide, Sulfathiazole, and Thiazide-type diuretics  Family History  Problem Relation Age of Onset  . Breast cancer Sister 81  . Breast cancer Maternal Aunt   . Breast cancer Cousin 51       paternal  . Prostate cancer Maternal Grandfather   . Alzheimer's disease Mother   . Diabetes Mother   . COPD Father   . Diabetes Father   . Bladder Cancer Neg Hx   . Kidney cancer Neg Hx     Social History Social History   Tobacco Use  . Smoking status: Former Smoker    Packs/day: 1.00    Years: 31.00    Pack years: 31.00    Types: Cigarettes    Quit date: 04/04/2017    Years since quitting: 2.1  . Smokeless tobacco: Never Used  Substance Use Topics  . Alcohol use: Never  . Drug use: Never    Review of Systems Constitutional: No fever ENT: No sore throat. Cardiovascular: Denies chest pain. Respiratory: Denies shortness of breath. Gastrointestinal: No abdominal pain. Genitourinary: Positive for dysuria. Musculoskeletal: Negative for back pain. Skin: Negative for rash.   ____________________________________________   PHYSICAL EXAM:  VITAL SIGNS: ED Triage Vitals  Enc Vitals Group     BP 06/05/19 1742 (!) 137/94     Pulse Rate 06/05/19 1742 85     Resp 06/05/19 1742 16     Temp 06/05/19 1742 97.9 F (36.6 C)     Temp Source 06/05/19 1742 Oral     SpO2 06/05/19 1742 99 %     Weight 06/05/19 1739 200 lb (90.7 kg)     Height 06/05/19 1739 5\' 6"  (1.676 m)     Head Circumference --      Peak Flow --      Pain Score 06/05/19 1739 0     Pain Loc --      Pain Edu? --      Excl. in [place]? --     Constitutional: Alert and oriented. Well appearing and in no acute distress. ENT      Head: Normocephalic and atraumatic. Cardiovascular: Normal rate, regular rhythm. Grossly normal heart sounds.  Good peripheral  circulation. Respiratory: Normal respiratory effort without tachypnea nor retractions. Breath sounds are clear and equal bilaterally. No wheezes, rales, rhonchi. Gastrointestinal: Soft and nontender.No CVA tenderness. Musculoskeletal:  Ambulatory with walker.  Neurologic:  Normal speech and language. Speech is normal. No gait instability.  Skin:  Skin is warm, dry and intact. No rash noted. Psychiatric: Mood and affect are normal. Speech and behavior are normal. Patient exhibits appropriate insight and judgment   ___________________________________________   LABS (all labs ordered are listed, but only abnormal results are displayed)  Labs Reviewed  URINALYSIS, COMPLETE (UACMP) WITH MICROSCOPIC - Abnormal; Notable for the following components:      Result Value   APPearance CLOUDY (*)    Hgb urine dipstick TRACE (*)  Nitrite POSITIVE (*)    Leukocytes,Ua MODERATE (*)    Bacteria, UA MANY (*)    All other components within normal limits  URINE CULTURE     Care everywhere reviewed, October 2020 urinalysis culture report below Culture Urine >=100,000 colonies/mL Enterobacter asburiae/cloacae (A)   Specimen Collected on  Urine - Urinary bladder structure (body structure) 03/09/2019 12:43 PM  Result Narrative  . Specific collection method (i.e. Clean catch, Straight catheter, Indwelling catheter) is required for accurate processing of urine samples. If collection method not specified, specimen processed according to Clean Catch urine protocol. .  Organism Antibiotic Method Susceptibility  Enterobacter asburiae/cloacae Amikacin MIC Susceptible   Amoxicillin + Clavulanic Acid MIC Resistant   Ampicillin MIC Resistant   Ampicillin + Sulbactam MIC Resistant   Cefazolin MIC Resistant   Cefepime MIC Susceptible   Ceftriaxone MIC Intermediate   Cefuroxime MIC Resistant   Ciprofloxacin MIC Susceptible   Ertapenem MIC Susceptible   Gentamicin MIC Susceptible   Imipenem MIC  Susceptible   Meropenem MIC Susceptible   Nitrofurantoin MIC Resistant   Piperacillin/Tazobactam MIC Susceptible   Tetracycline MIC Susceptible   Tobramycin MIC Susceptible   Trimethoprim + Sulfamethoxazole MIC Susceptible    02/22/2019 creatinine 1.6.  Creatinine clearance calculated at 44. ____________________________________________   PROCEDURES Procedures    INITIAL IMPRESSION / ASSESSMENT AND PLAN / ED COURSE  Pertinent labs & imaging results that were available during my care of the patient were reviewed by me and considered in my medical decision making (see chart for details).  Patient with family for complaints of dysuria.  Of note patient has renal insufficiency and has had previous UTIs with varying resistance and sensitivities, appears most often sensitive to Cipro.  Most recent urine culture as above, as well urine culture from January 2020 noted to Citrobacter that was resistant to cephalosporins and zosyn as well as previous E. coli urine from October 2019 resistant to penicillins, cephalosporins.   Well-appearing patient.  Daughter at bedside.  Urinalysis is reviewed, consistent with UTI.  We will culture.  Will treat with oral Cipro 250 twice daily.  Sending for culture.  Encourage supportive care and close monitoring.Discussed indication, risks and benefits of medications with patient. Discussed follow up with Primary care physician this week. Discussed follow up and return parameters including no resolution or any worsening concerns. Patient verbalized understanding and agreed to plan.   ____________________________________________   FINAL CLINICAL IMPRESSION(S) / ED DIAGNOSES  Final diagnoses:  Urinary tract infection with hematuria, site unspecified     ED Discharge Orders         Ordered    ciprofloxacin (CIPRO) 250 MG tablet  Every 12 hours     06/05/19 1825           Note: This dictation was prepared with Dragon dictation along with smaller  phrase technology. Any transcriptional errors that result from this process are unintentional.         Marylene Land, NP 06/05/19 2022

## 2019-06-07 LAB — URINE CULTURE: Culture: >=100000 — AB

## 2019-06-29 ENCOUNTER — Telehealth: Payer: Self-pay | Admitting: *Deleted

## 2019-06-29 NOTE — Telephone Encounter (Signed)
(  06/29/2019) Left message for patient to notify them that it is time to schedule annual low dose lung cancer screening CT scan. Instructed patient to call back to verify information prior to the scan being scheduled SRW

## 2019-07-21 ENCOUNTER — Telehealth: Payer: Self-pay

## 2019-07-21 NOTE — Telephone Encounter (Signed)
Message left notifying patient that it is time to schedule annual low dose lung cancer screening CT scan. Instructed patient to return call at [phone] to verify information prior to the scan being scheduled.

## 2019-08-03 ENCOUNTER — Telehealth: Payer: Self-pay

## 2019-08-03 NOTE — Telephone Encounter (Signed)
Message left notifying patient that it is time to schedule the low dose lung cancer screening CT scan.  Instructed patient to return call to Shawn Perkins at [phone] to verify information prior to CT scan being scheduled.    

## 2019-11-16 ENCOUNTER — Encounter: Payer: Self-pay | Admitting: *Deleted

## 2019-11-26 IMAGING — MG MM DIGITAL SCREENING BILAT W/ TOMO W/ CAD
8 of 12 series · 8 of 28 positions shown · non-contrast
Comparison: Previous exam(s).

CLINICAL DATA: Screening.

EXAM:
2D DIGITAL SCREENING BILATERAL MAMMOGRAM WITH 3D TOMO WITH CAD

[R CC]
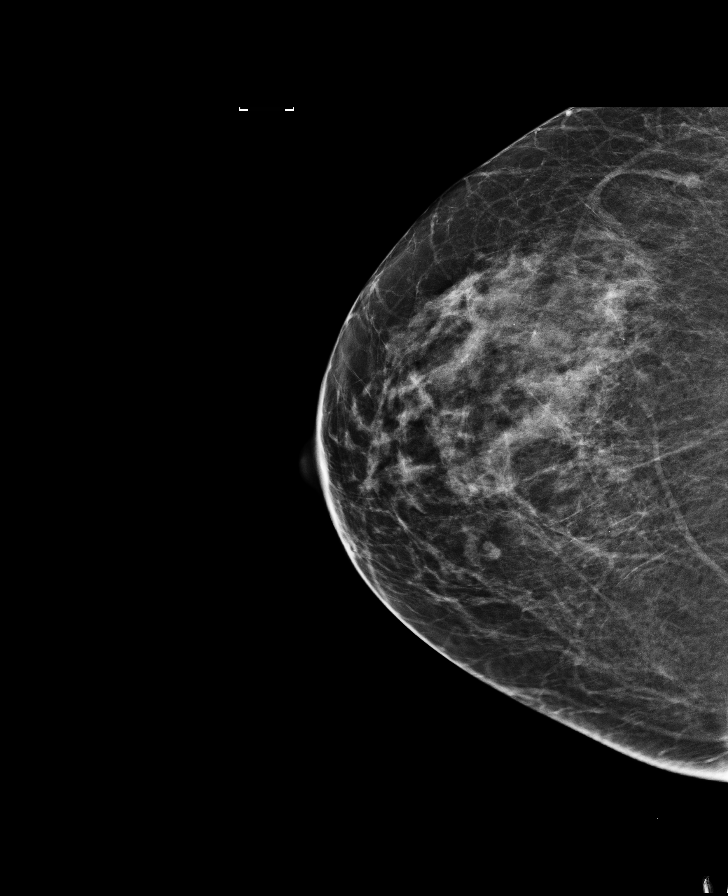

[L MLO]
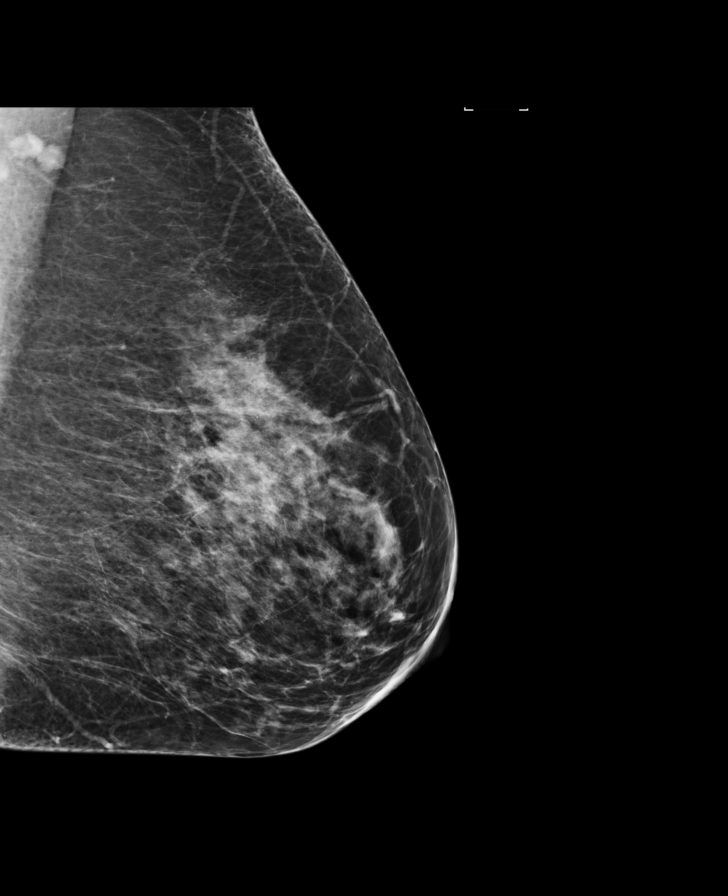

[R MLO]
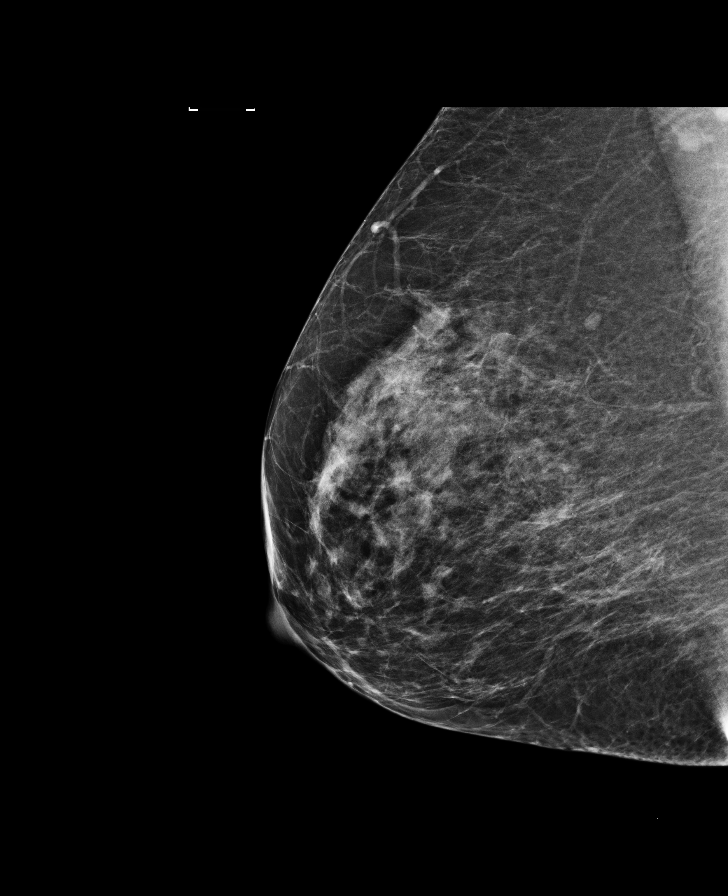

[R MLO synth-2D]
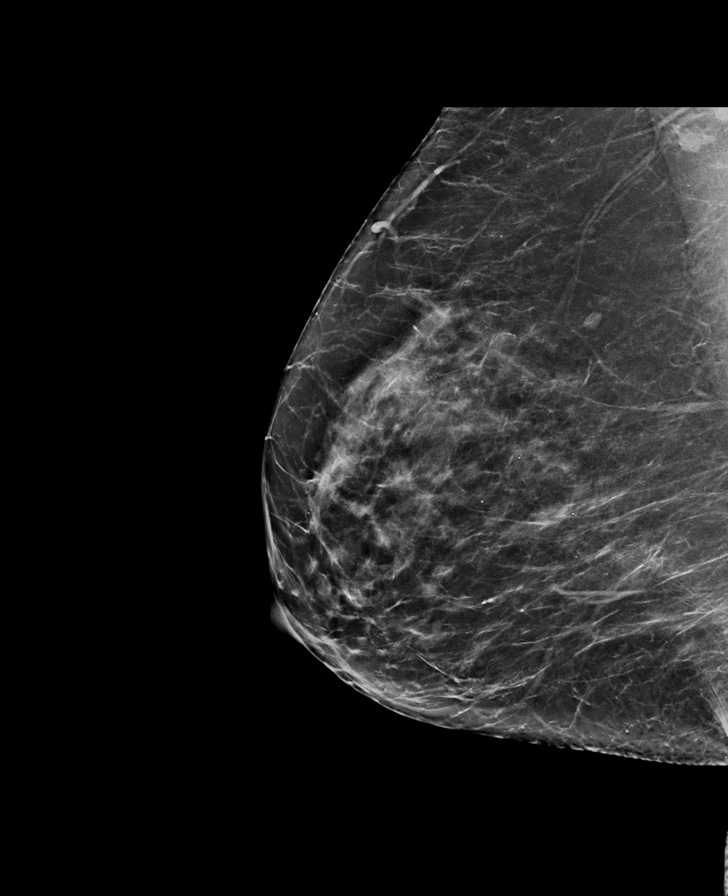

[L CC]
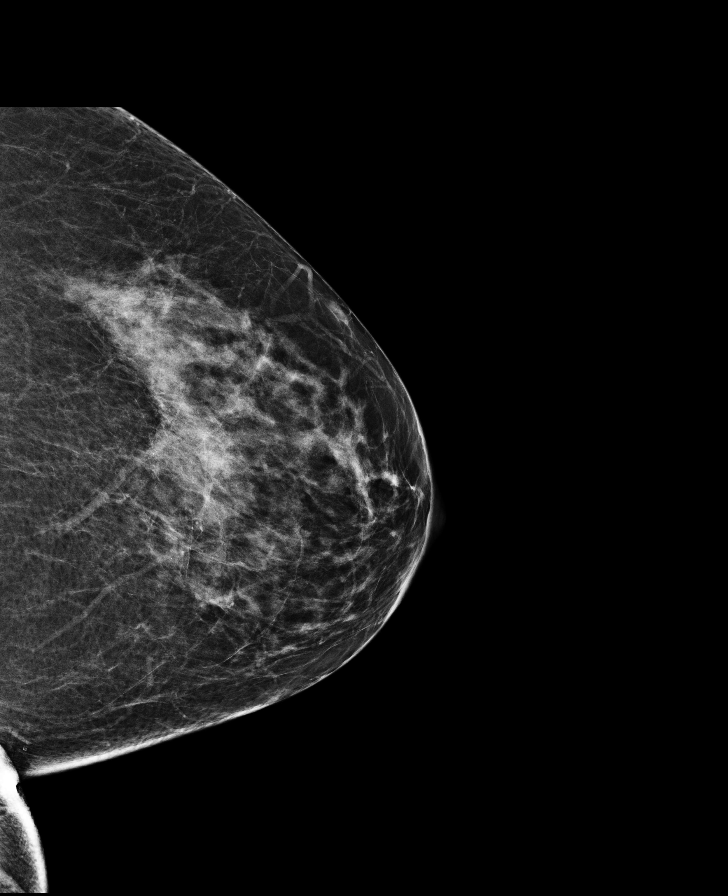

[L CC synth-2D]
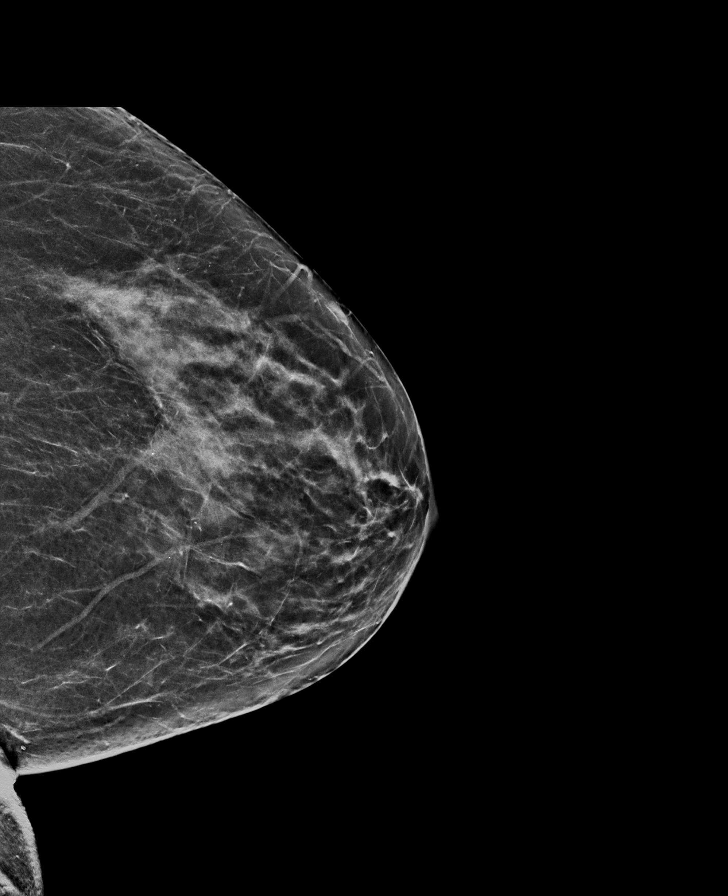

[L MLO synth-2D]
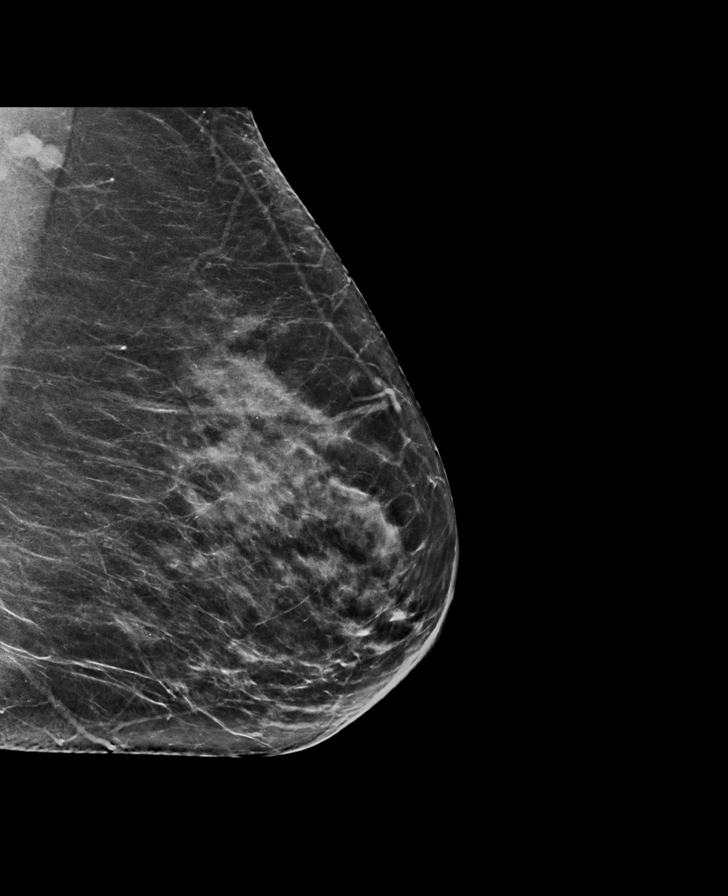

[R CC synth-2D]
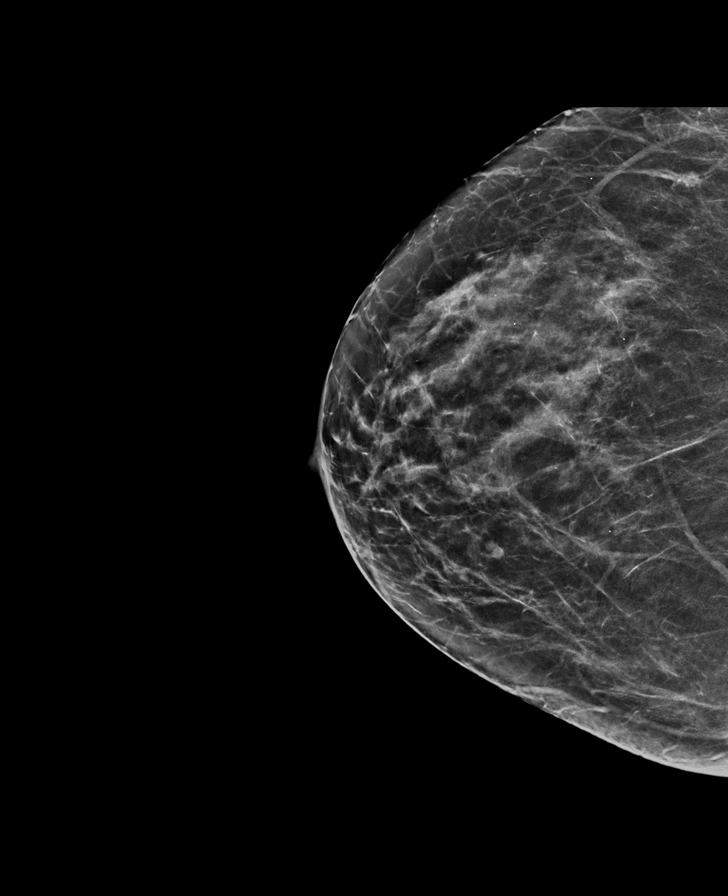

[8 of 28 positions shown; findings below may reference images not displayed]

ACR Breast Density Category c: The breast tissue is heterogeneously
dense, which may obscure small masses.
FINDINGS: In the right breast, a possible mass warrants further evaluation. In
the left breast, no findings suspicious for malignancy. Images were
processed with CAD.
IMPRESSION: Further evaluation is suggested for possible mass in the right
breast.

RECOMMENDATION:
Diagnostic mammogram and possibly ultrasound of the right breast.
(Code:[id number])

The patient will be contacted regarding the findings, and additional
imaging will be scheduled.

BI-RADS CATEGORY  0: Incomplete. Need additional imaging evaluation
and/or prior mammograms for comparison.

## 2020-03-15 ENCOUNTER — Other Ambulatory Visit: Payer: Self-pay | Admitting: Orthopedic Surgery

## 2020-03-15 DIAGNOSIS — Z96641 Presence of right artificial hip joint: Secondary | ICD-10-CM

## 2020-03-27 ENCOUNTER — Other Ambulatory Visit: Payer: Medicare Other

## 2020-04-10 ENCOUNTER — Ambulatory Visit
Admission: RE | Admit: 2020-04-10 | Discharge: 2020-04-10 | Disposition: A | Discharge status: Home / Self Care | Payer: Medicare Other | Source: Ambulatory Visit | Attending: Orthopedic Surgery | Admitting: Orthopedic Surgery

## 2020-04-10 ENCOUNTER — Other Ambulatory Visit: Payer: Self-pay

## 2020-04-10 ENCOUNTER — Encounter
Admission: RE | Admit: 2020-04-10 | Discharge: 2020-04-10 | Disposition: A | Discharge status: Home / Self Care | Payer: Medicare Other | Source: Ambulatory Visit | Attending: Orthopedic Surgery | Admitting: Orthopedic Surgery

## 2020-04-10 DIAGNOSIS — Z96641 Presence of right artificial hip joint: Secondary | ICD-10-CM | POA: Diagnosis not present

## 2020-04-10 MED ORDER — TECHNETIUM TC 99M MEDRONATE IV KIT
20.0000 | PACK | Freq: Once | INTRAVENOUS | Status: AC | PRN
  Administered 2020-04-10: 20.68 via INTRAVENOUS

## 2020-05-15 ENCOUNTER — Other Ambulatory Visit: Payer: Self-pay

## 2020-05-15 ENCOUNTER — Ambulatory Visit
Admission: EM | Admit: 2020-05-15 | Discharge: 2020-05-15 | Disposition: A | Discharge status: Home / Self Care | Payer: Medicare Other | Attending: Sports Medicine | Admitting: Sports Medicine

## 2020-05-15 DIAGNOSIS — Z87891 Personal history of nicotine dependence: Secondary | ICD-10-CM | POA: Insufficient documentation

## 2020-05-15 DIAGNOSIS — R454 Irritability and anger: Secondary | ICD-10-CM | POA: Diagnosis not present

## 2020-05-15 DIAGNOSIS — Z79899 Other long term (current) drug therapy: Secondary | ICD-10-CM | POA: Diagnosis not present

## 2020-05-15 DIAGNOSIS — Z7982 Long term (current) use of aspirin: Secondary | ICD-10-CM | POA: Diagnosis not present

## 2020-05-15 DIAGNOSIS — Z20822 Contact with and (suspected) exposure to covid-19: Secondary | ICD-10-CM | POA: Insufficient documentation

## 2020-05-15 LAB — URINALYSIS, COMPLETE (UACMP) WITH MICROSCOPIC
Bilirubin Urine: NEGATIVE
Glucose, UA: NEGATIVE mg/dL
Hgb urine dipstick: NEGATIVE
Ketones, ur: NEGATIVE mg/dL
Leukocytes,Ua: NEGATIVE
Nitrite: NEGATIVE
Protein, ur: NEGATIVE mg/dL
Specific Gravity, Urine: 1.02 (ref 1.005–1.030)
pH: 5.5 (ref 5.0–8.0)

## 2020-05-15 NOTE — ED Triage Notes (Signed)
Patient presents with daughter. States that patient has been fussy and when this normally happens she is having a Urinary tract infection. 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

## 2020-05-15 NOTE — Discharge Instructions (Addendum)
Her UA was reassuring.  At the request of her daughter we will send off a culture. Although it is an atypical presentation, we will go ahead and test her for Covid.  There are no rapid tests available.  We will send it to the hospital.  If she is positive someone will contact her.  If she is negative then her daughter knows to log into MyChart and check her status. Recommended just watchful waiting, plenty of fluids including plenty of water to flush her system. Her daughter will watch her closely with supportive care and follow-up with [place] as needed.

## 2020-05-15 NOTE — ED Provider Notes (Signed)
[facility]    CSN: [id number] Arrival date & time: 05/15/20  1709      History   Chief Complaint Chief Complaint  Patient presents with  . Fussy    HPI Nancy Perkins is a 75 y.o. female.   Pleasant 75 year old female who presents for evaluation of increased irritability.  She is here with her daughter.  Her daughter insist that when she gets irritable she usually has urinary tract infection or some other issue going on.  They did a clean-catch urine at home and brought her down for evaluation.  She denies any headaches, ear pain, sore throat, chest pain, shortness of breath, sinus pain, postnasal drip, cough, abdominal pain, flank pain, dysuria, hematuria, blood in the urine or stool.  Patient does admit that she is more irritable and fussy but does not know why.  No red flag signs or symptoms elicited on history.     Past Medical History:  Diagnosis Date  . Acid reflux   . Cancer ([place])    skin ca  . Chronic kidney disease (CKD), stage III (moderate) ([place]) 12/09/2012  . 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 Current tobacco use 10/09/2010  . Essential (primary) hypertension 10/09/2010  . Hernia, rectovaginal 10/10/2012  . Mixed incontinence 10/10/2012  . Personal history of tobacco use, presenting hazards to health 02/05/2015  . Vascular dementia [facility])     Patient Active Problem List   Diagnosis Date Noted  . 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 Small vessel disease, cerebrovascular 11/30/2017  . Dementia, neurological ([place]) 08/31/2017  . Sensory neuropathy 08/31/2017  . Repeated falls 07/26/2017  . History of closed hip dislocation 05/31/2017  . Personal history of tobacco use, presenting hazards to health 02/05/2015  . Chronic kidney disease (CKD), stage III (moderate) ([place]) 12/09/2012  . Mixed incontinence 10/10/2012  . Hernia, rectovaginal 10/10/2012  . Essential (primary) hypertension 10/09/2010   . 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 Current tobacco use 10/09/2010    Past Surgical History:  Procedure Laterality Date  . ABDOMINAL HYSTERECTOMY    . HIP CLOSED REDUCTION Right 05/31/2017   Procedure: CLOSED REDUCTION HIP;  Surgeon: Leim Fabry, MD;  Location: [facility] ORS;  Service: Orthopedics;  Laterality: Right;  . HIP CLOSED REDUCTION Right 04/05/2018   Procedure: CLOSED REDUCTION HIP;  Surgeon: Dereck Leep, MD;  Location: [facility] ORS;  Service: Orthopedics;  Laterality: Right;  . HIP SURGERY    . REPLACEMENT TOTAL KNEE BILATERAL      OB History   No obstetric history on file.      Home Medications    Prior to Admission medications   Medication Sig Start Date End Date Taking? Authorizing Provider  albuterol (PROVENTIL HFA;VENTOLIN HFA) 108 (90 Base) MCG/ACT inhaler continuous as needed. 09/06/15  Yes [provider]  aspirin 81 MG tablet Take 81 mg by mouth. 10/08/10  Yes [provider]  buPROPion (WELLBUTRIN XL) 300 MG 24 hr tablet Take 1 tablet by mouth daily. 05/28/17  Yes [provider]  cephALEXin (KEFLEX) 250 MG capsule Take 1 capsule by mouth daily. 05/28/17  Yes [provider]  docusate sodium (COLACE) 100 MG capsule Take 100 mg by mouth.   Yes [provider]  DULoxetine (CYMBALTA) 60 MG capsule Take 1 capsule by mouth daily. 05/12/17  Yes [provider]  HYDROcodone-acetaminophen (NORCO/VICODIN) 5-325 MG tablet Take 1 tablet by mouth every 4 (four) hours as  needed. 06/01/17  Yes Dedra Skeens, PA-C  Magnesium 250 MG TABS Take 250 mg by mouth daily.  10/08/10  Yes [provider]  nystatin cream (MYCOSTATIN) continuous as needed. 07/09/14  Yes [provider]  omeprazole (PRILOSEC) 40 MG capsule Take 40 mg by mouth daily.  09/08/12  Yes [provider]  potassium chloride (KLOR-CON) 10 MEQ tablet Take 10 mEq by mouth daily.  10/08/10  Yes [provider]   simvastatin (ZOCOR) 20 MG tablet Take 20 mg by mouth at bedtime.  04/17/15  Yes [provider]  Vitamin D, Ergocalciferol, (DRISDOL) 50000 units CAPS capsule Take 50,000 Units by mouth every 7 (seven) days.    Yes [provider]    Family History Family History  Problem Relation Age of Onset  . Breast cancer Sister 24  . Breast cancer Maternal Aunt   . Breast cancer Cousin 80       paternal  . Prostate cancer Maternal Grandfather   . Alzheimer's disease Mother   . Diabetes Mother   . COPD Father   . Diabetes Father   . Bladder Cancer Neg Hx   . Kidney cancer Neg Hx     Social History Social History   Tobacco Use  . Smoking status: Former Smoker    Packs/day: 1.00    Years: 31.00    Pack years: 31.00    Types: Cigarettes    Quit date: 04/04/2017    Years since quitting: 3.1  . Smokeless tobacco: Never Used  Vaping Use  . Vaping Use: Never used  Substance Use Topics  . Alcohol use: Never  . Drug use: Never     Allergies   Beta adrenergic blockers, Hydrochlorothiazide, Sulfathiazole, and Thiazide-type diuretics   Review of Systems Review of Systems  Constitutional: Negative for activity change, appetite change, chills, fatigue and fever.  HENT: Negative for congestion, ear discharge, ear pain, postnasal drip, rhinorrhea, sinus pressure, sinus pain, sneezing and sore throat.   Eyes: Negative for pain.  Respiratory: Negative for cough, chest tightness and shortness of breath.   Cardiovascular: Negative for chest pain.  Gastrointestinal: Negative for abdominal pain, diarrhea, nausea and vomiting.  Genitourinary: Negative for dysuria.  Skin: Negative for color change, pallor, rash and wound.  Neurological: Negative for dizziness, weakness, light-headedness and headaches.     Physical Exam Triage Vital Signs ED Triage Vitals  Enc Vitals Group     BP 05/15/20 1959 140/84     Pulse Rate 05/15/20 1959 88     Resp 05/15/20 1959 18     Temp  05/15/20 1959 97.8 F (36.6 C)     Temp Source 05/15/20 1959 Oral     SpO2 05/15/20 1959 97 %     Weight 05/15/20 1905 200 lb (90.7 kg)     Height 05/15/20 1905 5\' 6"  (1.676 m)     Head Circumference --      Peak Flow --      Pain Score 05/15/20 1905 6     Pain Loc --      Pain Edu? --      Excl. in [place]? --    No data found.  Updated Vital Signs BP 140/84 (BP Location: Right Arm)   Pulse 88   Temp 97.8 F (36.6 C) (Oral)   Resp 18   Ht 5\' 6"  (1.676 m)   Wt 90.7 kg   SpO2 97%   BMI 32.28 kg/m   Visual Acuity Right Eye Distance:  Left Eye Distance:   Bilateral Distance:    Right Eye Near:   Left Eye Near:    Bilateral Near:     Physical Exam Vitals and nursing note reviewed.  Constitutional:      General: She is not in acute distress.    Appearance: Normal appearance. She is not ill-appearing or toxic-appearing.  HENT:     Head: Normocephalic and atraumatic.  Cardiovascular:     Rate and Rhythm: Normal rate and regular rhythm.     Pulses: Normal pulses.     Heart sounds: Murmur heard.  No friction rub. No gallop.      Comments: 2 out of 6 systolic ejection murmur. Pulmonary:     Effort: Pulmonary effort is normal. No respiratory distress.     Breath sounds: Normal breath sounds. No stridor. No wheezing, rhonchi or rales.  Skin:    General: Skin is warm and dry.     Capillary Refill: Capillary refill takes less than 2 seconds.  Neurological:     General: No focal deficit present.     Mental Status: She is alert and oriented to person, place, and time.  Psychiatric:        Mood and Affect: Mood normal.        Behavior: Behavior normal.      UC Treatments / Results  Labs (all labs ordered are listed, but only abnormal results are displayed) Labs Reviewed  URINALYSIS, COMPLETE (UACMP) WITH MICROSCOPIC - Abnormal; Notable for the following components:      Result Value   Bacteria, UA FEW (*)    All other components within normal limits  URINE CULTURE   SARS CORONAVIRUS 2 (TAT 6-24 HRS)    EKG   Radiology No results found.  Procedures Procedures (including critical care time)  Medications Ordered in UC Medications - No data to display  Initial Impression / Assessment and Plan / UC Course  I have reviewed the triage vital signs and the nursing notes.  Pertinent labs & imaging results that were available during my care of the patient were reviewed by me and considered in my medical decision making (see chart for details).  Clinical impression: 75 year old female with increased irritability and fussiness.  UA does not show any bacteria, leukocytes, or nitrites.  Patient is not exhibiting any other symptoms other than fussiness and irritability.  Treatment plan: 1.  The findings and treatment plan were discussed in detail with the patient and her daughter.  All parties were in agreement and voiced verbal understanding. 2.  Her UA was reassuring.  At the insistence of her daughter we will send off a culture. 3.  Although it is an atypical presentation, we will go ahead and test her for Covid.  There are no rapid tests available.  We will send it to the hospital.  If she is positive someone will contact her.  If she is negative then her daughter knows to log into MyChart and check her status. 4.  Recommended just watchful waiting, plenty of fluids including plenty of water to flush her system. 5.  Her daughter will watch her closely with supportive care and follow-up with [place] as needed.      Final Clinical Impressions(s) / UC Diagnoses   Final diagnoses:  Irritability     Discharge Instructions     Her UA was reassuring.  At the request of her daughter we will send off a culture. Although it is an atypical presentation, we will go ahead  and test her for Covid.  There are no rapid tests available.  We will send it to the hospital.  If she is positive someone will contact her.  If she is negative then her daughter knows to log  into MyChart and check her status. Recommended just watchful waiting, plenty of fluids including plenty of water to flush her system. Her daughter will watch her closely with supportive care and follow-up with [place] as needed.    ED Prescriptions    None     PDMP not reviewed this encounter.   Verda Cumins, MD 05/15/20 [phone]

## 2020-05-16 LAB — SARS CORONAVIRUS 2 (TAT 6-24 HRS): SARS Coronavirus 2: NEGATIVE

## 2020-05-17 LAB — URINE CULTURE
Culture: <10000 — AB
Special Requests: NORMAL

## 2020-11-06 ENCOUNTER — Encounter: Payer: Self-pay | Admitting: Emergency Medicine

## 2020-11-06 ENCOUNTER — Ambulatory Visit
Admission: EM | Admit: 2020-11-06 | Discharge: 2020-11-06 | Disposition: A | Discharge status: Home / Self Care | Payer: Medicare Other | Attending: Family Medicine | Admitting: Family Medicine

## 2020-11-06 ENCOUNTER — Other Ambulatory Visit: Payer: Self-pay

## 2020-11-06 DIAGNOSIS — N3001 Acute cystitis with hematuria: Secondary | ICD-10-CM | POA: Diagnosis not present

## 2020-11-06 LAB — POCT URINALYSIS DIP (DEVICE)
Bilirubin Urine: NEGATIVE
Glucose, UA: NEGATIVE mg/dL
Ketones, ur: NEGATIVE mg/dL
Nitrite: NEGATIVE
Protein, ur: NEGATIVE mg/dL
Specific Gravity, Urine: 1.02 (ref 1.005–1.030)
Urobilinogen, UA: 0.2 mg/dL (ref 0.0–1.0)
pH: 7 (ref 5.0–8.0)

## 2020-11-06 MED ORDER — CIPROFLOXACIN HCL 500 MG PO TABS: 500.0000 mg | ORAL_TABLET | Freq: Two times a day (BID) | ORAL | 0 refills | Status: DC

## 2020-11-06 NOTE — ED Triage Notes (Signed)
Pt c/o dysuria and foul smelling urine. Started about 4 days ago. She has been sleeping more since her symptoms started.

## 2020-11-08 NOTE — ED Provider Notes (Signed)
[facility]    CSN: [id number] Arrival date & time: 11/06/20  1740      History   Chief Complaint Chief Complaint  Patient presents with   Dysuria    HPI  75 year old female presents with urinary symptoms.  She has a history of recurrent UTI.  Has incontinence.  She reports urinary frequency and dysuria.  Also has foul-smelling urine.  Daughter states that she has been sleeping more than she normally does.  Denies abdominal pain.  No significant back pain.  No fever.  She is on daily Keflex for prophylaxis.  No relieving factors.  No other associated symptoms.    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 04/05/2018   Small vessel disease, cerebrovascular 11/30/2017   Dementia, neurological ([place]) 08/31/2017   Sensory neuropathy 08/31/2017   Repeated falls 07/26/2017   History of closed hip dislocation 05/31/2017   Personal history of tobacco use, presenting hazards to health 02/05/2015   Chronic kidney disease (CKD), stage III (moderate) ([place]) 12/09/2012   Mixed incontinence 10/10/2012   Hernia, rectovaginal 10/10/2012   Essential (primary) hypertension 10/09/2010   Combined fat and carbohydrate induced hyperlipemia 10/09/2010   Current tobacco use 10/09/2010    Past Surgical History:  Procedure Laterality Date   ABDOMINAL HYSTERECTOMY     HIP CLOSED REDUCTION Right 05/31/2017   Procedure: CLOSED REDUCTION HIP;  Surgeon: Leim Fabry, MD;   Location: [facility] ORS;  Service: Orthopedics;  Laterality: Right;   HIP CLOSED REDUCTION Right 04/05/2018   Procedure: CLOSED REDUCTION HIP;  Surgeon: Dereck Leep, MD;  Location: [facility] ORS;  Service: Orthopedics;  Laterality: Right;   HIP SURGERY     REPLACEMENT TOTAL KNEE BILATERAL      OB History   No obstetric history on file.      Home Medications    Prior to Admission medications   Medication Sig Start Date End Date Taking? Authorizing Provider  albuterol (PROVENTIL HFA;VENTOLIN HFA) 108 (90 Base) MCG/ACT inhaler continuous as needed. 09/06/15  Yes [provider]  aspirin 81 MG tablet Take 81 mg by mouth. 10/08/10  Yes [provider]  buPROPion (WELLBUTRIN XL) 300 MG 24 hr tablet Take 1 tablet by mouth daily. 05/28/17  Yes [provider]  cephALEXin (KEFLEX) 250 MG capsule Take 1 capsule by mouth daily. 05/28/17  Yes [provider]  ciprofloxacin (CIPRO) 500 MG tablet Take 1 tablet (500 mg total) by mouth 2 (two) times daily. 11/06/20  Yes Alcee Sipos G, DO  docusate sodium (COLACE) 100 MG capsule Take 100 mg by mouth.   Yes [provider]  DULoxetine (CYMBALTA) 60 MG capsule Take 1 capsule by mouth daily. 05/12/17  Yes [provider]  HYDROcodone-acetaminophen (NORCO/VICODIN) 5-325 MG tablet Take 1 tablet by mouth every 4 (four) hours as needed. 06/01/17  Yes Reche Dixon, PA-C  Magnesium 250 MG TABS Take 250 mg by mouth daily.  10/08/10  Yes [provider]  nystatin cream (MYCOSTATIN) continuous as needed. 07/09/14  Yes [provider]  omeprazole (PRILOSEC) 40 MG capsule Take 40 mg by mouth daily.  09/08/12  Yes [provider]  potassium chloride (KLOR-CON) 10 MEQ tablet Take 10 mEq by mouth daily.  10/08/10  Yes [provider]  simvastatin (ZOCOR) 20 MG tablet Take 20 mg by mouth at bedtime.  04/17/15  Yes [provider]  Vitamin D, Ergocalciferol, (DRISDOL) 50000 units CAPS capsule  Take 50,000 Units by mouth every 7 (seven) days.    Yes [provider]    Family History Family History  Problem Relation Age of Onset   Breast cancer Sister 13   Breast cancer Maternal Aunt    Breast cancer Cousin 71       paternal   Prostate cancer Maternal Grandfather    Alzheimer's disease Mother    Diabetes Mother    COPD Father    Diabetes Father    Bladder Cancer Neg Hx    Kidney cancer Neg Hx     Social History Social History   Tobacco Use   Smoking status: Former    Packs/day: 1.00    Years: 31.00    Pack years: 31.00    Types: Cigarettes    Quit date: 04/04/2017    Years since quitting: 3.6   Smokeless tobacco: Never  Vaping Use   Vaping Use: Never used  Substance Use Topics   Alcohol use: Never   Drug use: Never     Allergies   Beta adrenergic blockers, Hydrochlorothiazide, Sulfathiazole, and Thiazide-type diuretics   Review of Systems Review of Systems  Constitutional:  Negative for fever.  Genitourinary:  Positive for dysuria and frequency.    Physical Exam Triage Vital Signs ED Triage Vitals  Enc Vitals Group     BP 11/06/20 1817 132/84     Pulse Rate 11/06/20 1817 90     Resp 11/06/20 1817 18     Temp 11/06/20 1817 98.6 F (37 C)     Temp Source 11/06/20 1817 Oral     SpO2 11/06/20 1817 94 %     Weight 11/06/20 1813 199 lb 15.3 oz (90.7 kg)     Height 11/06/20 1813 5\' 6"  (1.676 m)     Head Circumference --      Peak Flow --      Pain Score 11/06/20 1813 0     Pain Loc --      Pain Edu? --      Excl. in [place]? --    Updated Vital Signs BP 132/84 (BP Location: Left Arm)   Pulse 90   Temp 98.6 F (37 C) (Oral)   Resp 18   Ht 5\' 6"  (1.676 m)   Wt 90.7 kg   SpO2 94%   BMI 32.27 kg/m   Visual Acuity Right Eye Distance:   Left Eye Distance:   Bilateral Distance:    Right Eye Near:   Left Eye Near:    Bilateral Near:     Physical Exam Constitutional:      General: She is not in acute distress.    Appearance:  Normal appearance. She is obese. She is not ill-appearing.  HENT:     Head: Normocephalic and atraumatic.  Cardiovascular:     Rate and Rhythm: Normal rate and regular rhythm.  Pulmonary:     Effort: Pulmonary effort is normal.     Breath sounds: Normal breath sounds. No wheezing, rhonchi or  rales.  Abdominal:     General: There is no distension.     Palpations: Abdomen is soft.     Tenderness: There is no abdominal tenderness. There is no right CVA tenderness or left CVA tenderness.  Neurological:     Mental Status: She is alert.  Psychiatric:        Mood and Affect: Mood normal.        Behavior: Behavior normal.     UC Treatments / Results  Labs (all labs ordered are listed, but only abnormal results are displayed) Labs Reviewed  POCT URINALYSIS DIP (DEVICE) - Abnormal; Notable for the following components:      Result Value   Hgb urine dipstick TRACE (*)    Leukocytes,Ua MODERATE (*)    All other components within normal limits  URINE CULTURE  POCT URINALYSIS DIPSTICK, ED / UC    EKG   Radiology No results found.  Procedures Procedures (including critical care time)  Medications Ordered in UC Medications - No data to display  Initial Impression / Assessment and Plan / UC Course  I have reviewed the triage vital signs and the nursing notes.  Pertinent labs & imaging results that were available during my care of the patient were reviewed by me and considered in my medical decision making (see chart for details).    75 year old female presents with UTI. I am placing the patient on Cipro due to previous culture results.  Prior culture of Citrobacter and 2020 was resistant to cephalosporins.  She is also had a prior culture of Pseudomonas which was sensitive to Cipro.  Awaiting culture results.  Final Clinical Impressions(s) / UC Diagnoses   Final diagnoses:  Acute cystitis with hematuria   Discharge Instructions   None    ED Prescriptions     Medication  Sig Dispense Auth. Provider   ciprofloxacin (CIPRO) 500 MG tablet Take 1 tablet (500 mg total) by mouth 2 (two) times daily. 14 tablet Coral Spikes, DO      PDMP not reviewed this encounter.   Thersa Salt [place], [place] 11/08/20 [phone]

## 2020-11-09 LAB — URINE CULTURE: Culture: >=100000 — AB

## 2021-04-30 ENCOUNTER — Other Ambulatory Visit: Payer: Self-pay

## 2021-04-30 ENCOUNTER — Ambulatory Visit
Admission: EM | Admit: 2021-04-30 | Discharge: 2021-04-30 | Disposition: A | Discharge status: Home / Self Care | Payer: Medicare Other | Attending: Emergency Medicine | Admitting: Emergency Medicine

## 2021-04-30 DIAGNOSIS — N390 Urinary tract infection, site not specified: Secondary | ICD-10-CM | POA: Diagnosis present

## 2021-04-30 LAB — URINALYSIS, COMPLETE (UACMP) WITH MICROSCOPIC
Bilirubin Urine: NEGATIVE
Glucose, UA: NEGATIVE mg/dL
Ketones, ur: NEGATIVE mg/dL
Nitrite: NEGATIVE
Protein, ur: 30 mg/dL — AB
Specific Gravity, Urine: 1.02 (ref 1.005–1.030)
pH: 6.5 (ref 5.0–8.0)

## 2021-04-30 MED ORDER — CEPHALEXIN 500 MG PO CAPS: 500.0000 mg | ORAL_CAPSULE | Freq: Two times a day (BID) | ORAL | 0 refills | Status: DC

## 2021-04-30 MED ORDER — NITROFURANTOIN MONOHYD MACRO 100 MG PO CAPS: 100.0000 mg | ORAL_CAPSULE | Freq: Two times a day (BID) | ORAL | 0 refills | Status: DC

## 2021-04-30 MED ORDER — PHENAZOPYRIDINE HCL 200 MG PO TABS: 200.0000 mg | ORAL_TABLET | Freq: Three times a day (TID) | ORAL | 0 refills | Status: DC

## 2021-04-30 NOTE — ED Triage Notes (Signed)
Pt here with daughter, pt C/O urine problems since this morning.

## 2021-04-30 NOTE — ED Provider Notes (Signed)
[facility]    CSN: [id number] Arrival date & time: 04/30/21  1712      History   Chief Complaint Chief Complaint  Patient presents with   Dysuria    HPI Nancy Perkins is a 75 y.o. female.   HPI  33 old female here for evaluation of urinary complaints.  Patient is here with her daughter for evaluation painful urination that has been going on for approximately a week.  She came in today because she noticed blood in her urine.  Patient also had increased urinary urgency, frequency, incontinence, and increase in nocturia.  She denies fever.  She has had some low back pain but states that she typically has low back pain and is not sure if this is new or not.  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 04/05/2018   Small vessel disease, cerebrovascular 11/30/2017   Dementia, neurological ([place]) 08/31/2017   Sensory neuropathy 08/31/2017   Repeated falls 07/26/2017   History of closed hip dislocation 05/31/2017   Personal history of tobacco use, presenting hazards to health 02/05/2015   Chronic kidney disease (CKD), stage III (moderate) ([place]) 12/09/2012   Mixed incontinence 10/10/2012   Hernia, rectovaginal 10/10/2012   Essential (primary) hypertension 10/09/2010   Combined fat and carbohydrate induced hyperlipemia 10/09/2010   Current tobacco use 10/09/2010    Past Surgical History:  Procedure Laterality Date   ABDOMINAL HYSTERECTOMY      HIP CLOSED REDUCTION Right 05/31/2017   Procedure: CLOSED REDUCTION HIP;  Surgeon: Leim Fabry, MD;  Location: [facility] ORS;  Service: Orthopedics;  Laterality: Right;   HIP CLOSED REDUCTION Right 04/05/2018   Procedure: CLOSED REDUCTION HIP;  Surgeon: Dereck Leep, MD;  Location: [facility] ORS;  Service: Orthopedics;  Laterality: Right;   HIP SURGERY     REPLACEMENT TOTAL KNEE BILATERAL      OB History   No obstetric history on file.      Home Medications    Prior to Admission medications   Medication Sig Start Date End Date Taking? Authorizing Provider  albuterol (PROVENTIL HFA;VENTOLIN HFA) 108 (90 Base) MCG/ACT inhaler continuous as needed. 09/06/15  Yes [provider]  aspirin 81 MG tablet Take 81 mg by mouth. 10/08/10  Yes [provider]  buPROPion (WELLBUTRIN XL) 300 MG 24 hr tablet Take 1 tablet by mouth daily. 05/28/17  Yes [provider]  docusate sodium (COLACE) 100 MG capsule Take 100 mg by mouth.   Yes [provider]  DULoxetine (CYMBALTA) 60 MG capsule Take 1 capsule by mouth daily. 05/12/17  Yes [provider]  Magnesium 250 MG TABS Take 250 mg by mouth daily.  10/08/10  Yes [provider]  nitrofurantoin, macrocrystal-monohydrate, (MACROBID) 100 MG capsule Take 1 capsule (100 mg total) by mouth 2 (two) times daily. 04/30/21  Yes Margarette Canada, NP  nystatin cream (MYCOSTATIN) continuous as needed. 07/09/14  Yes [provider]  omeprazole (Pinon)  40 MG capsule Take 40 mg by mouth daily.  09/08/12  Yes [provider]  phenazopyridine (PYRIDIUM) 200 MG tablet Take 1 tablet (200 mg total) by mouth 3 (three) times daily. 04/30/21  Yes Margarette Canada, NP  potassium chloride (KLOR-CON) 10 MEQ tablet Take 10 mEq by mouth daily.  10/08/10  Yes [provider]  simvastatin (ZOCOR) 20 MG tablet Take 20 mg by mouth at bedtime.  04/17/15  Yes [provider]  Vitamin D, Ergocalciferol, (DRISDOL) 50000  units CAPS capsule Take 50,000 Units by mouth every 7 (seven) days.    Yes [provider]    Family History Family History  Problem Relation Age of Onset   Breast cancer Sister 2   Breast cancer Maternal Aunt    Breast cancer Cousin 74       paternal   Prostate cancer Maternal Grandfather    Alzheimer's disease Mother    Diabetes Mother    COPD Father    Diabetes Father    Bladder Cancer Neg Hx    Kidney cancer Neg Hx     Social History Social History   Tobacco Use   Smoking status: Former    Packs/day: 1.00    Years: 31.00    Pack years: 31.00    Types: Cigarettes    Quit date: 04/04/2017    Years since quitting: 4.0   Smokeless tobacco: Never  Vaping Use   Vaping Use: Never used  Substance Use Topics   Alcohol use: Never   Drug use: Never     Allergies   Beta adrenergic blockers, Hydrochlorothiazide, Sulfathiazole, and Thiazide-type diuretics   Review of Systems Review of Systems  Constitutional:  Negative for activity change, appetite change and fever.  Genitourinary:  Positive for dysuria, frequency, hematuria and urgency.  Musculoskeletal:  Positive for back pain.  Hematological: Negative.   Psychiatric/Behavioral: Negative.      Physical Exam Triage Vital Signs ED Triage Vitals  Enc Vitals Group     BP 04/30/21 1836 134/84     Pulse Rate 04/30/21 1836 90     Resp 04/30/21 1836 18     Temp 04/30/21 1836 98.3 F (36.8 C)     Temp src --      SpO2 04/30/21 1836 97 %     Weight 04/30/21 1835 200 lb (90.7 kg)     Height 04/30/21 1835 5\' 7"  (1.702 m)     Head Circumference --      Peak Flow --      Pain Score 04/30/21 1835 0     Pain Loc --      Pain Edu? --      Excl. in [place]? --    No data found.  Updated Vital Signs BP 134/84    Pulse 90    Temp 98.3 F (36.8 C)    Resp 18    Ht 5\' 7"  (1.702 m)    Wt 200 lb (90.7 kg)    SpO2 97%    BMI 31.32 kg/m   Visual Acuity Right Eye Distance:   Left Eye Distance:   Bilateral  Distance:    Right Eye Near:   Left Eye Near:    Bilateral Near:     Physical Exam Vitals and nursing note reviewed.  Constitutional:      General: She is not in acute distress.    Appearance: Normal appearance. She is not ill-appearing.  HENT:     Head: Normocephalic and atraumatic.  Cardiovascular:     Rate and Rhythm: Normal rate and regular rhythm.     Pulses: Normal pulses.     Heart sounds: Normal heart sounds. No murmur heard.   No gallop.  Pulmonary:     Effort: Pulmonary effort is normal.     Breath sounds: Normal breath sounds. No wheezing, rhonchi or rales.  Abdominal:     Tenderness: There is no right CVA tenderness or left CVA tenderness.  Skin:    General: Skin is warm and dry.     Capillary Refill: Capillary refill takes less than 2 seconds.     Findings: No erythema or rash.  Neurological:     General: No focal deficit present.     Mental Status: She is alert and oriented to person, place, and time.  Psychiatric:        Mood and Affect: Mood normal.        Behavior: Behavior normal.        Thought Content: Thought content normal.     UC Treatments / Results  Labs (all labs ordered are listed, but only abnormal results are displayed) Labs Reviewed  URINALYSIS, COMPLETE (UACMP) WITH MICROSCOPIC - Abnormal; Notable for the following components:      Result Value   APPearance HAZY (*)    Hgb urine dipstick MODERATE (*)    Protein, ur 30 (*)    Leukocytes,Ua SMALL (*)    Bacteria, UA FEW (*)    All other components within normal limits  URINE CULTURE    EKG   Radiology No results found.  Procedures Procedures (including critical care time)  Medications Ordered in UC Medications - No data to display  Initial Impression / Assessment and Plan / UC Course  I have reviewed the triage vital signs and the nursing notes.  Pertinent labs & imaging results that were available during my care of the patient were reviewed by me and considered in my  medical decision making (see chart for details).  Patient is a nontoxic-appearing 75 year old female here for evaluation of urinary complaints that have been present for the last week and intensified today with the presence of hematuria as outlined in HPI above.  Patient has not had a fever, nausea, or vomiting.  She has a history of recurrent UTIs and indicates that she is on Keflex nightly as prophylaxis.  She does have a history of incontinence and wears a depends.  On physical exam patient has a benign cardiopulmonary exam with clear lung sounds in all fields.  No CVA tenderness on exam.  Urinalysis was collected at triage and shows moderate hemoglobin, 30 protein, small leukocytes, 11-20 WBCs, and few bacteria.  Nitrite negative.  Initially Keflex was collected for removal so I was going to treat patient with Keflex twice daily for 5 days.  The patient and her daughter then reported that the patient takes Keflex nightly for prophylaxis so I switched the antibiotic to Macrobid twice daily for 5 days.  We will also give Pyridium to help with urinary discomfort.  I have encouraged patient to increase oral fluid intake so that she increases urine production to flush her system.  We will send urine for culture and adjust therapy based on those results if necessary.   Final Clinical Impressions(s) / UC Diagnoses   Final diagnoses:  Lower urinary tract infectious disease     Discharge Instructions      Take the Macrobid twice daily for 5 days with food for treatment of  urinary tract infection.  Use the Pyridium every 8 hours as needed for urinary discomfort.  This will turn your urine a bright red-orange.  Increase your oral fluid intake so that you increase your urine production and or flushing your urinary system.  Take an over-the-counter probiotic, such as Culturelle-Align-Activia, 1 hour after each dose of antibiotic to prevent diarrhea or yeast infections from forming.  We will culture  urine and change the antibiotics if necessary.  Return for reevaluation, or see your primary care provider, for any new or worsening symptoms.      ED Prescriptions     Medication Sig Dispense Auth. Provider   cephALEXin (KEFLEX) 500 MG capsule  (Status: Discontinued) Take 1 capsule (500 mg total) by mouth 2 (two) times daily for 5 days. 20 capsule Margarette Canada, NP   phenazopyridine (PYRIDIUM) 200 MG tablet Take 1 tablet (200 mg total) by mouth 3 (three) times daily. 6 tablet Margarette Canada, NP   nitrofurantoin, macrocrystal-monohydrate, (MACROBID) 100 MG capsule Take 1 capsule (100 mg total) by mouth 2 (two) times daily. 10 capsule Margarette Canada, NP      PDMP not reviewed this encounter.   Margarette Canada, NP 04/30/21 1925

## 2021-04-30 NOTE — Discharge Instructions (Addendum)

## 2021-05-02 LAB — URINE CULTURE

## 2021-06-19 ENCOUNTER — Other Ambulatory Visit: Payer: Self-pay | Admitting: Nurse Practitioner

## 2021-06-19 DIAGNOSIS — Z1231 Encounter for screening mammogram for malignant neoplasm of breast: Secondary | ICD-10-CM

## 2021-07-28 ENCOUNTER — Ambulatory Visit
Admission: RE | Admit: 2021-07-28 | Discharge: 2021-07-28 | Disposition: A | Discharge status: Home / Self Care | Payer: Medicare Other | Source: Ambulatory Visit | Attending: Nurse Practitioner | Admitting: Nurse Practitioner

## 2021-07-28 ENCOUNTER — Other Ambulatory Visit: Payer: Self-pay

## 2021-07-28 DIAGNOSIS — Z1231 Encounter for screening mammogram for malignant neoplasm of breast: Secondary | ICD-10-CM | POA: Insufficient documentation

## 2021-10-20 ENCOUNTER — Encounter (INDEPENDENT_AMBULATORY_CARE_PROVIDER_SITE_OTHER): Payer: Self-pay | Admitting: Vascular Surgery

## 2022-03-09 ENCOUNTER — Encounter (INDEPENDENT_AMBULATORY_CARE_PROVIDER_SITE_OTHER): Payer: Self-pay

## 2022-04-07 ENCOUNTER — Ambulatory Visit
Admission: EM | Admit: 2022-04-07 | Discharge: 2022-04-07 | Disposition: A | Discharge status: Home / Self Care | Payer: Medicare Other | Attending: Emergency Medicine | Admitting: Emergency Medicine

## 2022-04-07 ENCOUNTER — Encounter: Payer: Self-pay | Admitting: Emergency Medicine

## 2022-04-07 ENCOUNTER — Ambulatory Visit: Admit: 2022-04-07 | Discharge status: Against Medical Advice | Payer: Medicare Other

## 2022-04-07 DIAGNOSIS — L089 Local infection of the skin and subcutaneous tissue, unspecified: Secondary | ICD-10-CM | POA: Diagnosis not present

## 2022-04-07 DIAGNOSIS — T148XXA Other injury of unspecified body region, initial encounter: Secondary | ICD-10-CM

## 2022-04-07 MED ORDER — DOXYCYCLINE HYCLATE 100 MG PO CAPS: 100.0000 mg | ORAL_CAPSULE | Freq: Two times a day (BID) | ORAL | 0 refills | Status: AC

## 2022-04-07 NOTE — ED Provider Notes (Signed)
HPI  SUBJECTIVE:  Nancy Perkins is a 76 y.o. female who presents with several days of pain, erythema, increased temperature around the skin tear sustained on her left forearm after having a trip and fall 2 weeks ago.  The patient's daughter has been keeping the area clean, and has been using Steri-Strips, antibiotic ointment.  She removed the Steri-Strips several days ago, and the patient started picking at the scab with resulting erythema.  No fevers, drainage, altered mental status.  No aggravating or alleviating factors.  Patient has a past medical history of chronic kidney disease stage III, NPH, vascular dementia, hypertension.  She is on 81 mg of aspirin daily.  No history of MRSA, diabetes.  PCP: [facility].  Primary history obtained from daughter.    Past Medical History:  Diagnosis Date   Acid reflux    Cancer ([place])    skin ca   Chronic kidney disease (CKD), stage III (moderate) (HCC) 12/09/2012   Combined fat and carbohydrate induced hyperlipemia 10/09/2010   Current tobacco use 10/09/2010   Essential (primary) hypertension 10/09/2010   Hernia, rectovaginal 10/10/2012   Mixed incontinence 10/10/2012   Personal history of tobacco use, presenting hazards to health 02/05/2015   Vascular dementia [facility])     Past Surgical History:  Procedure Laterality Date   ABDOMINAL HYSTERECTOMY     HIP CLOSED REDUCTION Right 05/31/2017   Procedure: CLOSED REDUCTION HIP;  Surgeon: Leim Fabry, MD;  Location: [facility] ORS;  Service: Orthopedics;  Laterality: Right;   HIP CLOSED REDUCTION Right 04/05/2018   Procedure: CLOSED REDUCTION HIP;  Surgeon: Dereck Leep, MD;  Location: [facility] ORS;  Service: Orthopedics;  Laterality: Right;   HIP SURGERY     REPLACEMENT TOTAL KNEE BILATERAL      Family History  Problem Relation Age of Onset   Breast cancer Sister 33   Breast cancer Maternal Aunt    Breast cancer Cousin 71       paternal   Prostate cancer Maternal Grandfather     Alzheimer's disease Mother    Diabetes Mother    COPD Father    Diabetes Father    Bladder Cancer Neg Hx    Kidney cancer Neg Hx     Social History   Tobacco Use   Smoking status: Former    Packs/day: 1.00    Years: 31.00    Total pack years: 31.00    Types: Cigarettes    Quit date: 04/04/2017    Years since quitting: 5.0   Smokeless tobacco: Never  Vaping Use   Vaping Use: Never used  Substance Use Topics   Alcohol use: Never   Drug use: Never    No current facility-administered medications for this encounter.  Current Outpatient Medications:    albuterol (PROVENTIL HFA;VENTOLIN HFA) 108 (90 Base) MCG/ACT inhaler, continuous as needed., Disp: , Rfl:    aspirin 81 MG tablet, Take 81 mg by mouth., Disp: , Rfl:    buPROPion (WELLBUTRIN XL) 300 MG 24 hr tablet, Take 1 tablet by mouth daily., Disp: , Rfl:    cephALEXin (KEFLEX) 250 MG capsule, Take 250 mg by mouth daily., Disp: , Rfl:    docusate sodium (COLACE) 100 MG capsule, Take 100 mg by mouth., Disp: , Rfl:    doxycycline (VIBRAMYCIN) 100 MG capsule, Take 1 capsule (100 mg total) by mouth 2 (two) times daily for 10 days., Disp: 20 capsule, Rfl: 0   DULoxetine (CYMBALTA) 60 MG capsule, Take 1 capsule by  mouth daily., Disp: , Rfl:    Magnesium 250 MG TABS, Take 250 mg by mouth daily. , Disp: , Rfl:    nystatin cream (MYCOSTATIN), continuous as needed., Disp: , Rfl:    omeprazole (PRILOSEC) 40 MG capsule, Take 40 mg by mouth daily. , Disp: , Rfl:    potassium chloride (KLOR-CON) 10 MEQ tablet, Take 10 mEq by mouth daily. , Disp: , Rfl:    simvastatin (ZOCOR) 20 MG tablet, Take 20 mg by mouth at bedtime. , Disp: , Rfl:    Vitamin D, Ergocalciferol, (DRISDOL) 50000 units CAPS capsule, Take 50,000 Units by mouth every 7 (seven) days. , Disp: , Rfl:   Allergies  Allergen Reactions   Beta Adrenergic Blockers Other (See Comments)    Heart block   Hydrochlorothiazide Other (See Comments)    Caused a heartblock   Sulfathiazole      Other reaction(s): RASH   Thiazide-Type Diuretics     Heart block caused by beta blockers in general     ROS  As noted in HPI.   Physical Exam  BP 118/68 (BP Location: Right Arm)   Pulse 92   Temp 98.8 F (37.1 C) (Oral)   Resp 18   Ht '5\' 7"'$  (1.702 m)   Wt 90.7 kg   SpO2 100%   BMI 31.32 kg/m   Constitutional: Well developed, well nourished, no acute distress Eyes:  EOMI, conjunctiva normal bilaterally HENT: Normocephalic, atraumatic,mucus membranes moist Respiratory: Normal inspiratory effort Cardiovascular: Normal rate GI: nondistended skin: 2 healing skin tears with eschar.  5 x7 cm tender area of blanchable erythema with no induration, expressible purulent drainage.    Musculoskeletal: no deformities Neurologic: Alert & oriented x 3, no focal neuro deficits Psychiatric: Speech and behavior appropriate   ED Course   Medications - No data to display  No orders of the defined types were placed in this encounter.   No results found for this or any previous visit (from the past 24 hour(s)). No results found.  ED Clinical Impression  1. Infected skin tear      ED Assessment/Plan     Patient with an infected skin tear.  Advised to keep clean with soap and water, home with doxycycline for 10 days.  No more antibacterial ointment.  Keep covered during the day, let it air at night.  Follow-up with PCP as needed.  Discussed  MDM, treatment plan, and plan for follow-up with family. Discussed sn/sx that should prompt return to the ED. family agrees with plan.   Meds ordered this encounter  Medications   doxycycline (VIBRAMYCIN) 100 MG capsule    Sig: Take 1 capsule (100 mg total) by mouth 2 (two) times daily for 10 days.    Dispense:  20 capsule    Refill:  0      *This clinic note was created using [employer]. Therefore, there may be occasional mistakes despite careful proofreading.  ?    Melynda Ripple, MD 04/07/22 2013

## 2022-04-07 NOTE — ED Triage Notes (Signed)
Left forearm skin tear 2 weeks ago, thinks it is infected. Was covered and started picking at it this weekend

## 2022-04-07 NOTE — Discharge Instructions (Signed)
keep clean with soap and water, finish the doxycycline, even if you feel better.  No more antibacterial ointment.  Keep covered during the day, let it air at night.

## 2022-07-22 ENCOUNTER — Emergency Department: Payer: Medicare Other

## 2022-07-22 ENCOUNTER — Emergency Department
Admission: EM | Admit: 2022-07-22 | Discharge: 2022-07-22 | Disposition: A | Discharge status: Home / Self Care | Payer: Medicare Other | Attending: Emergency Medicine | Admitting: Emergency Medicine

## 2022-07-22 ENCOUNTER — Encounter: Payer: Self-pay | Admitting: Intensive Care

## 2022-07-22 ENCOUNTER — Other Ambulatory Visit: Payer: Self-pay

## 2022-07-22 DIAGNOSIS — I10 Essential (primary) hypertension: Secondary | ICD-10-CM | POA: Insufficient documentation

## 2022-07-22 DIAGNOSIS — I951 Orthostatic hypotension: Secondary | ICD-10-CM | POA: Diagnosis not present

## 2022-07-22 DIAGNOSIS — R42 Dizziness and giddiness: Secondary | ICD-10-CM | POA: Insufficient documentation

## 2022-07-22 DIAGNOSIS — N390 Urinary tract infection, site not specified: Secondary | ICD-10-CM | POA: Insufficient documentation

## 2022-07-22 DIAGNOSIS — W19XXXA Unspecified fall, initial encounter: Secondary | ICD-10-CM | POA: Diagnosis not present

## 2022-07-22 DIAGNOSIS — R531 Weakness: Secondary | ICD-10-CM | POA: Diagnosis not present

## 2022-07-22 LAB — BASIC METABOLIC PANEL
Anion gap: 9 (ref 5–15)
BUN: 36 mg/dL — ABNORMAL HIGH (ref 8–23)
CO2: 22 mmol/L (ref 22–32)
Calcium: 9.6 mg/dL (ref 8.9–10.3)
Chloride: 109 mmol/L (ref 98–111)
Creatinine, Ser: 1.78 mg/dL — ABNORMAL HIGH (ref 0.44–1.00)
GFR, Estimated: 29 mL/min — ABNORMAL LOW (ref >60)
Glucose, Bld: 107 mg/dL — ABNORMAL HIGH (ref 70–99)
Potassium: 4.7 mmol/L (ref 3.5–5.1)
Sodium: 140 mmol/L (ref 135–145)

## 2022-07-22 LAB — URINALYSIS, ROUTINE W REFLEX MICROSCOPIC
Bilirubin Urine: NEGATIVE
Glucose, UA: NEGATIVE mg/dL
Hgb urine dipstick: NEGATIVE
Ketones, ur: NEGATIVE mg/dL
Nitrite: POSITIVE — AB
Protein, ur: NEGATIVE mg/dL
Specific Gravity, Urine: 1.01 (ref 1.005–1.030)
pH: 6 (ref 5.0–8.0)

## 2022-07-22 LAB — CBC
HCT: 38.3 % (ref 36.0–46.0)
Hemoglobin: 12.4 g/dL (ref 12.0–15.0)
MCH: 30.8 pg (ref 26.0–34.0)
MCHC: 32.4 g/dL (ref 30.0–36.0)
MCV: 95.3 fL (ref 80.0–100.0)
Platelets: 220 10*3/uL (ref 150–400)
RBC: 4.02 MIL/uL (ref 3.87–5.11)
RDW: 12.6 % (ref 11.5–15.5)
WBC: 9.5 10*3/uL (ref 4.0–10.5)
nRBC: 0 % (ref 0.0–0.2)

## 2022-07-22 MED ORDER — CIPROFLOXACIN HCL 500 MG PO TABS: 500.0000 mg | ORAL_TABLET | Freq: Two times a day (BID) | ORAL | 0 refills | Status: AC

## 2022-07-22 MED ORDER — CIPROFLOXACIN HCL 500 MG PO TABS
500.0000 mg | ORAL_TABLET | Freq: Once | ORAL | Status: AC
  Administered 2022-07-22: 500 mg via ORAL
  Filled 2022-07-22: qty 1

## 2022-07-22 MED ORDER — SODIUM CHLORIDE 0.9 % IV BOLUS
1000.0000 mL | Freq: Once | INTRAVENOUS | Status: AC
  Administered 2022-07-22: 1000 mL via INTRAVENOUS

## 2022-07-22 NOTE — ED Notes (Signed)
Pt to CT at this time.

## 2022-07-22 NOTE — ED Notes (Signed)
Pt had saturated brief and small stool present. Pt cleaned up and clean brief and chux placed. Pt pulled up in bed and warm blankets given. External cath placed.

## 2022-07-22 NOTE — ED Triage Notes (Addendum)
Pt to ED via [facility] from home. Pt reports 2nd fall today. No LOC, no head trauma. EMS reports upon standing no brachial or radial pulse. Pt given 500cc NS. Pt did vomit x1 prior to EMS arrival.   EMS VS:  BP 136/70 HR 80 afib

## 2022-07-22 NOTE — ED Notes (Addendum)
Per family pt dropped to 41s in O2. Pt currently showing 100% on RA and pt doesn't appear in distress. Pt family requested pt be placed on O2 at least for comfort. Pt placed on 1L [place] at this time.

## 2022-07-22 NOTE — ED Provider Notes (Signed)
[facility] Provider Note   Event Date/Time   First MD Initiated Contact with Patient 07/22/22 1633     (approximate) History  Fall  HPI Nancy Perkins is a 77 y.o. female with a stated past medical history of hypertension who presents complaining of multiple falls due to lower extremity weakness and lightheadedness over the last few weeks.  Patient states that she has had 2 falls today after attempting to stand and becoming very lightheaded causing her to fall to the ground.  Patient denies any loss of consciousness or head trauma.  Patient denies any recent medication changes.  Patient denies any dehydration or poor p.o. intake.  EMS reports patient had no brachial or radial pulse upon standing.  Patient was given 500 cc of NS prior to arrival and patient had 1 episode of vomiting prior to arrival. ROS: Patient currently denies any vision changes, tinnitus, difficulty speaking, facial droop, sore throat, chest pain, shortness of breath, abdominal pain, diarrhea, dysuria, or numbness/paresthesias in any extremity   Physical Exam  Triage Vital Signs: ED Triage Vitals  Enc Vitals Group     BP 07/22/22 1623 131/79     Pulse Rate 07/22/22 1623 83     Resp 07/22/22 1623 18     Temp 07/22/22 1623 (!) 97.5 F (36.4 C)     Temp Source 07/22/22 1623 Oral     SpO2 07/22/22 1623 95 %     Weight 07/22/22 1624 200 lb (90.7 kg)     Height 07/22/22 1624 '5\' 7"'$  (1.702 m)     Head Circumference --      Peak Flow --      Pain Score 07/22/22 1624 7     Pain Loc --      Pain Edu? --      Excl. in [place]? --    Most recent vital signs: Vitals:   07/22/22 1839 07/22/22 2000  BP:  (!) 149/79  Pulse: 82 80  Resp: 17 18  Temp:    SpO2: 94% 95%   General: Awake, oriented x4. CV:  Good peripheral perfusion.  Resp:  Normal effort.  Abd:  No distention.  Other:  Elderly overweight Caucasian female laying in bed in no acute distress ED Results / Procedures / Treatments   Labs (all labs ordered are listed, but only abnormal results are displayed) Labs Reviewed  BASIC METABOLIC PANEL - Abnormal; Notable for the following components:      Result Value   Glucose, Bld 107 (*)    BUN 36 (*)    Creatinine, Ser 1.78 (*)    GFR, Estimated 29 (*)    All other components within normal limits  URINALYSIS, ROUTINE W REFLEX MICROSCOPIC - Abnormal; Notable for the following components:   Color, Urine YELLOW (*)    APPearance CLEAR (*)    Nitrite POSITIVE (*)    Leukocytes,Ua SMALL (*)    Bacteria, UA RARE (*)    All other components within normal limits  CBC   EKG ED ECG REPORT I, Naaman Plummer, the attending physician, personally viewed and interpreted this ECG. Date: 07/22/2022 EKG Time: 1640 Rate: 84 Rhythm: normal sinus rhythm QRS Axis: normal Intervals: normal ST/T Wave abnormalities: normal Narrative Interpretation: no evidence of acute ischemia RADIOLOGY ED MD interpretation: CT of the head without contrast interpreted by me shows no evidence of acute abnormalities including no intracerebral hemorrhage, obvious masses, or significant edema -Agree with radiology assessment Official radiology report(s): CT  HEAD WO CONTRAST  Result Date: 07/22/2022 CLINICAL DATA:  Head trauma EXAM: CT HEAD WITHOUT CONTRAST TECHNIQUE: Contiguous axial images were obtained from the base of the skull through the vertex without intravenous contrast. RADIATION DOSE REDUCTION: This exam was performed according to the departmental dose-optimization program which includes automated exposure control, adjustment of the mA and/or kV according to patient size and/or use of iterative reconstruction technique. COMPARISON:  MRI head 08/11/2017 FINDINGS: Brain: No evidence of acute infarction, hemorrhage, extra-axial collection or mass lesion/mass effect. Again seen is mild diffuse atrophy. Right frontal approach ventriculostomy catheter is present with tip just left of midline in the  frontal horn of the left lateral ventricle. There is mild-to-moderate ventricular dilatation which is similar to the prior exam. There is stable patchy periventricular and deep white matter hypodensity, likely chronic small vessel ischemic change. Small amount of air seen in the cavernous sinuses bilaterally. Vascular: Atherosclerotic calcifications are present within the cavernous internal carotid arteries. Skull: Normal. Negative for fracture or focal lesion. Sinuses/Orbits: No acute finding. Other: None. IMPRESSION: 1. No acute intracranial process. 2. Stable right frontal approach ventriculostomy catheter with unchanged mild-to-moderate ventricular dilatation. 3. Small amount of air in the cavernous sinuses bilaterally may be related to recent IV placement. Electronically Signed   By: Ronney Asters M.D.   On: 07/22/2022 17:08   PROCEDURES: Critical Care performed: No .1-3 Lead EKG Interpretation  Performed by: Naaman Plummer, MD Authorized by: Naaman Plummer, MD     Interpretation: normal     ECG rate:  71   ECG rate assessment: normal     Rhythm: sinus rhythm     Ectopy: none     Conduction: normal    MEDICATIONS ORDERED IN ED: Medications  ciprofloxacin (CIPRO) tablet 500 mg (has no administration in time range)  sodium chloride 0.9 % bolus 1,000 mL (0 mLs Intravenous Stopped 07/22/22 1935)   IMPRESSION / MDM / ASSESSMENT AND PLAN / ED COURSE  I reviewed the triage vital signs and the nursing notes.                             The patient is on the cardiac monitor to evaluate for evidence of arrhythmia and/or significant heart rate changes. Patient's presentation is most consistent with acute presentation with potential threat to life or bodily function. 77 year old female presents after multiple episodes of lightheadedness today with evidence of UTI on evaluation Not Pregnant. Unlikely TOA, Ovarian Torsion, PID, gonorrhea/chlamydia. Low suspicion for Infected Urolithiasis, AAA,  Cholecystitis, Pancreatitis, SBO, Appendicitis, or other acute abdomen.  Rx: Ciprofloxacin 500 mg twice daily x 7 days Disposition: Discharge home. SRP discussed. Advise follow up with primary care provider within 24-72 hours.       FINAL CLINICAL IMPRESSION(S) / ED DIAGNOSES   Final diagnoses:  Urinary tract infection without hematuria, site unspecified  Orthostatic hypotension   Rx / DC Orders   ED Discharge Orders          Ordered    ciprofloxacin (CIPRO) 500 MG tablet  2 times daily        07/22/22 2057           Note:  This document was prepared using Dragon voice recognition software and may include unintentional dictation errors.   Naaman Plummer, MD 07/22/22 [phone]

## 2022-07-22 NOTE — ED Notes (Signed)
EKG given to Cheri Fowler, MD
